# Patient Record
Sex: Female | Born: 1965 | Race: White | Hispanic: No | Marital: Single | Smoking: Current every day smoker
Health system: Southern US, Community
[De-identification: ages and names within clinical notes are randomized; demographics above are authoritative.]

## PROBLEM LIST (undated history)

## (undated) DIAGNOSIS — M2560 Stiffness of unspecified joint, not elsewhere classified: Secondary | ICD-10-CM

## (undated) DIAGNOSIS — J45909 Unspecified asthma, uncomplicated: Secondary | ICD-10-CM

## (undated) DIAGNOSIS — M25449 Effusion, unspecified hand: Secondary | ICD-10-CM

## (undated) DIAGNOSIS — M542 Cervicalgia: Secondary | ICD-10-CM

## (undated) DIAGNOSIS — F32A Depression, unspecified: Secondary | ICD-10-CM

## (undated) DIAGNOSIS — Z96643 Presence of artificial hip joint, bilateral: Secondary | ICD-10-CM

## (undated) DIAGNOSIS — G8929 Other chronic pain: Secondary | ICD-10-CM

## (undated) DIAGNOSIS — M1990 Unspecified osteoarthritis, unspecified site: Secondary | ICD-10-CM

## (undated) DIAGNOSIS — F419 Anxiety disorder, unspecified: Secondary | ICD-10-CM

## (undated) DIAGNOSIS — M549 Dorsalgia, unspecified: Principal | ICD-10-CM

## (undated) DIAGNOSIS — Z720 Tobacco use: Secondary | ICD-10-CM

## (undated) DIAGNOSIS — J449 Chronic obstructive pulmonary disease, unspecified: Secondary | ICD-10-CM

## (undated) DIAGNOSIS — F329 Major depressive disorder, single episode, unspecified: Secondary | ICD-10-CM

## (undated) DIAGNOSIS — M2550 Pain in unspecified joint: Secondary | ICD-10-CM

## (undated) HISTORY — DX: Stiffness of unspecified joint, not elsewhere classified: M25.60

## (undated) HISTORY — PX: TOTAL HIP ARTHROPLASTY: SHX124

## (undated) HISTORY — DX: Dorsalgia, unspecified: M54.9

## (undated) HISTORY — DX: Other chronic pain: G89.29

## (undated) HISTORY — DX: Depression, unspecified: F32.A

## (undated) HISTORY — DX: Major depressive disorder, single episode, unspecified: F32.9

## (undated) HISTORY — DX: Presence of artificial hip joint, bilateral: Z96.643

## (undated) HISTORY — DX: Tobacco use: Z72.0

## (undated) HISTORY — DX: Chronic obstructive pulmonary disease, unspecified: J44.9

## (undated) HISTORY — DX: Anxiety disorder, unspecified: F41.9

## (undated) HISTORY — DX: Effusion, unspecified hand: M25.449

## (undated) HISTORY — DX: Cervicalgia: M54.2

## (undated) HISTORY — PX: TONSILLECTOMY: SUR1361

## (undated) HISTORY — DX: Pain in unspecified joint: M25.50

---

## 2014-01-14 ENCOUNTER — Encounter (HOSPITAL_COMMUNITY): Payer: Self-pay | Admitting: Family Medicine

## 2014-01-14 ENCOUNTER — Emergency Department (HOSPITAL_COMMUNITY)
Admission: EM | Admit: 2014-01-14 | Discharge: 2014-01-14 | Disposition: A | Discharge status: Home / Self Care | Payer: BC Managed Care – PPO | Attending: Family Medicine | Admitting: Family Medicine

## 2014-01-14 DIAGNOSIS — J45909 Unspecified asthma, uncomplicated: Secondary | ICD-10-CM | POA: Diagnosis not present

## 2014-01-14 DIAGNOSIS — F17200 Nicotine dependence, unspecified, uncomplicated: Secondary | ICD-10-CM | POA: Diagnosis not present

## 2014-01-14 DIAGNOSIS — M25559 Pain in unspecified hip: Secondary | ICD-10-CM | POA: Insufficient documentation

## 2014-01-14 DIAGNOSIS — M545 Low back pain, unspecified: Secondary | ICD-10-CM | POA: Diagnosis not present

## 2014-01-14 DIAGNOSIS — M549 Dorsalgia, unspecified: Secondary | ICD-10-CM | POA: Diagnosis present

## 2014-01-14 DIAGNOSIS — Z79899 Other long term (current) drug therapy: Secondary | ICD-10-CM | POA: Insufficient documentation

## 2014-01-14 DIAGNOSIS — M129 Arthropathy, unspecified: Secondary | ICD-10-CM | POA: Insufficient documentation

## 2014-01-14 HISTORY — DX: Unspecified asthma, uncomplicated: J45.909

## 2014-01-14 HISTORY — DX: Unspecified osteoarthritis, unspecified site: M19.90

## 2014-01-14 LAB — I-STAT CHEM 8, ED
BUN: 15 mg/dL (ref 6–23)
CREATININE: 0.8 mg/dL (ref 0.50–1.10)
Calcium, Ion: 1.18 mmol/L (ref 1.12–1.23)
Chloride: 110 mEq/L (ref 96–112)
Glucose, Bld: 85 mg/dL (ref 70–99)
HCT: 46 % (ref 36.0–46.0)
HEMOGLOBIN: 15.6 g/dL — AB (ref 12.0–15.0)
POTASSIUM: 3.6 meq/L — AB (ref 3.7–5.3)
SODIUM: 135 meq/L — AB (ref 137–147)
TCO2: 23 mmol/L (ref 0–100)

## 2014-01-14 MED ORDER — TRAMADOL HCL 50 MG PO TABS: 100.0000 mg | ORAL_TABLET | Freq: Four times a day (QID) | ORAL | Status: DC | PRN

## 2014-01-14 MED ORDER — KETOROLAC TROMETHAMINE 60 MG/2ML IM SOLN
60.0000 mg | Freq: Once | INTRAMUSCULAR | Status: AC
  Administered 2014-01-14: 60 mg via INTRAMUSCULAR
  Filled 2014-01-14: qty 2

## 2014-01-14 MED ORDER — NABUMETONE 500 MG PO TABS: 500.0000 mg | ORAL_TABLET | Freq: Every day | ORAL | Status: DC

## 2014-01-14 NOTE — ED Notes (Signed)
48 yo female with Osteoarthritic pain. HX of Bilat Hip replacement. Reports generalized pain from hips down to feet as wells as L/R hand pain. Pt alert oriented. New to the area recently being seen by Ortho and PCP.

## 2014-01-14 NOTE — ED Notes (Signed)
MD at bedside. 

## 2014-01-14 NOTE — ED Provider Notes (Addendum)
CSN: [id number]     Arrival date & time 01/14/14  0744 History   First MD Initiated Contact with Patient 01/14/14 0805     Chief Complaint  Patient presents with  . Osteoarthritis     (Consider location/radiation/quality/duration/timing/severity/associated sxs/prior Treatment) HPI Comments: Hip and back pain Constant Just moved to the area Has stairs to get to apartment Achy and throbbing  Hot shower w/ benefit Ran out of medication after moving from [place] In the past meloxicam w/o benefit. Tramadol 100 and hydrocodone 7.5 and Relafen w/ benefit.  daliy stretches w/ benefit.   The history is provided by the patient.      Past Medical History  Diagnosis Date  . Arthritis   . Asthma    Past Surgical History  Procedure Laterality Date  . Total hip arthroplasty Bilateral   . Tonsillectomy     History reviewed. No pertinent family history. History  Substance Use Topics  . Smoking status: Current Every Day Smoker -- 1.00 packs/day  . Smokeless tobacco: Not on file  . Alcohol Use: No   OB History   Grav Para Term Preterm Abortions TAB SAB Ect Mult Living                 Review of Systems  Respiratory: Negative for shortness of breath.   Cardiovascular: Negative for chest pain, palpitations and leg swelling.  Musculoskeletal: Positive for arthralgias, back pain, gait problem and joint swelling.  All other systems reviewed and are negative.     Allergies  Review of patient's allergies indicates no known allergies.  Home Medications   Prior to Admission medications   Medication Sig Start Date End Date Taking? Authorizing Provider  nabumetone (RELAFEN) 500 MG tablet Take 1-2 tablets (500-1,000 mg total) by mouth daily. 01/14/14   Waldemar Dickens, MD  traMADol (ULTRAM) 50 MG tablet Take 2 tablets (100 mg total) by mouth every 6 (six) hours as needed. 01/14/14   Waldemar Dickens, MD   BP 113/64  Pulse 77  Temp(Src) 97.8 F (36.6 C) (Oral)  SpO2  100% Physical Exam  Constitutional: She is oriented to person, place, and time. She appears well-developed and well-nourished. No distress.  HENT:  Head: Normocephalic and atraumatic.  Eyes: EOM are normal. Pupils are equal, round, and reactive to light.  Neck: Normal range of motion.  Cardiovascular: Normal rate.   Pulmonary/Chest: Effort normal and breath sounds normal.  Musculoskeletal:  FRom but slow due to pain.  Lower back pain that is not reproducible to palpation.  No joint effusion   Neurological: She is alert and oriented to person, place, and time. No cranial nerve deficit. Coordination normal.  Skin: Skin is warm and dry. She is not diaphoretic.  Psychiatric: She has a normal mood and affect. Her behavior is normal. Judgment normal.    ED Course  Procedures (including critical care time) Labs Review Labs Reviewed  I-STAT CHEM 8, ED    Imaging Review No results found.   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 Pt just moved to area and trying to get established w/ pcp. Chem 8 - nml renal function toradol 60IM - Rx for Relafen (start in 24 hours) and tramadol provided - continue rehab exercises Precautions given and all questions answered  Linna Darner, MD Family Medicine 01/14/2014, 8:37 AM  Waldemar Dickens, MD 01/14/14 [place], MD 01/14/14 [phone]

## 2014-01-14 NOTE — Discharge Instructions (Signed)
You have significant arthritis that will require further treatment Please get in to see a regular doctor Start the relafen in 24 hours Continue to do your daily stretches and exercises.  Best of luck  Arthralgia Arthralgia is joint pain. A joint is a place where two bones meet. Joint pain can happen for many reasons. The joint can be bruised, stiff, infected, or weak from aging. Pain usually goes away after resting and taking medicine for soreness.  HOME CARE  Rest the joint as told by your doctor.  Keep the sore joint raised (elevated) for the first 24 hours.  Put ice on the joint area.  Put ice in a plastic bag.  Place a towel between your skin and the bag.  Leave the ice on for 15-20 minutes, 03-04 times a day.  Wear your splint, casting, elastic bandage, or sling as told by your doctor.  Only take medicine as told by your doctor. Do not take aspirin.  Use crutches as told by your doctor. Do not put weight on the joint until told to by your doctor. GET HELP RIGHT AWAY IF:   You have bruising, puffiness (swelling), or more pain.  Your fingers or toes turn blue or start to lose feeling (numb).  Your medicine does not lessen the pain.  Your pain becomes severe.  You have a temperature by mouth above 102 F (38.9 C), not controlled by medicine.  You cannot move or use the joint. MAKE SURE YOU:   Understand these instructions.  Will watch your condition.  Will get help right away if you are not doing well or get worse. Document Released: 04/26/2009 Document Revised: 07/31/2011 Document Reviewed: 04/26/2009 [facility] Patient Information 2015 [place], [place]. This information is not intended to replace advice given to you by your health care provider. Make sure you discuss any questions you have with your health care provider.

## 2014-02-09 ENCOUNTER — Encounter (HOSPITAL_COMMUNITY): Payer: Self-pay | Admitting: Emergency Medicine

## 2014-02-09 ENCOUNTER — Emergency Department (HOSPITAL_COMMUNITY)
Admission: EM | Admit: 2014-02-09 | Discharge: 2014-02-09 | Disposition: A | Discharge status: Home / Self Care | Payer: BC Managed Care – PPO | Attending: Emergency Medicine | Admitting: Emergency Medicine

## 2014-02-09 DIAGNOSIS — M2559 Pain in other specified joint: Secondary | ICD-10-CM | POA: Diagnosis not present

## 2014-02-09 DIAGNOSIS — Z791 Long term (current) use of non-steroidal anti-inflammatories (NSAID): Secondary | ICD-10-CM | POA: Diagnosis not present

## 2014-02-09 DIAGNOSIS — M2550 Pain in unspecified joint: Secondary | ICD-10-CM

## 2014-02-09 DIAGNOSIS — Z96649 Presence of unspecified artificial hip joint: Secondary | ICD-10-CM | POA: Insufficient documentation

## 2014-02-09 DIAGNOSIS — J45909 Unspecified asthma, uncomplicated: Secondary | ICD-10-CM | POA: Diagnosis not present

## 2014-02-09 DIAGNOSIS — M129 Arthropathy, unspecified: Secondary | ICD-10-CM | POA: Diagnosis not present

## 2014-02-09 DIAGNOSIS — M549 Dorsalgia, unspecified: Secondary | ICD-10-CM | POA: Diagnosis present

## 2014-02-09 MED ORDER — TRAMADOL HCL 50 MG PO TABS: 100.0000 mg | ORAL_TABLET | Freq: Two times a day (BID) | ORAL | Status: DC | PRN

## 2014-02-09 MED ORDER — TRAMADOL HCL 50 MG PO TABS
100.0000 mg | ORAL_TABLET | Freq: Once | ORAL | Status: AC
  Administered 2014-02-09: 100 mg via ORAL
  Filled 2014-02-09: qty 2

## 2014-02-09 MED ORDER — NABUMETONE 500 MG PO TABS: 500.0000 mg | ORAL_TABLET | Freq: Every day | ORAL | Status: DC

## 2014-02-09 NOTE — ED Provider Notes (Signed)
CSN: [id number]     Arrival date & time 02/09/14  0109 History   First MD Initiated Contact with Patient 02/09/14 [phone]     Chief Complaint  Patient presents with  . Back Pain     (Consider location/radiation/quality/duration/timing/severity/associated sxs/prior Treatment) Patient is a 48 y.o. female presenting with back pain. The history is provided by the patient.  Back Pain Location:  Generalized Quality:  Aching and stiffness Stiffness is present:  All day Radiates to:  Does not radiate Pain severity:  Mild Pain is:  Same all the time Onset quality: chronic. Duration: since she was in her late 20's. Timing:  Constant Progression:  Waxing and waning Chronicity:  Chronic Relieved by:  NSAIDs and heating pad Associated symptoms: no bladder incontinence, no bowel incontinence, no chest pain, no fever, no headaches, no numbness, no paresthesias, no weakness and no weight loss   Risk factors: hx of osteoporosis   Risk factors: no hx of cancer    New to the area < 2 mo and has not found a PCP. Would like referral and has insurance. Bilateral hip replacements in the 30's due to severe OA.  Past Medical History  Diagnosis Date  . Arthritis   . Asthma    Past Surgical History  Procedure Laterality Date  . Total hip arthroplasty Bilateral   . Tonsillectomy     No family history on file. History  Substance Use Topics  . Smoking status: Current Every Day Smoker -- 1.00 packs/day  . Smokeless tobacco: Not on file  . Alcohol Use: No   OB History   Grav Para Term Preterm Abortions TAB SAB Ect Mult Living                 Review of Systems  Constitutional: Negative for fever and weight loss.  Cardiovascular: Negative for chest pain.  Gastrointestinal: Negative for bowel incontinence.  Genitourinary: Negative for bladder incontinence.  Musculoskeletal: Positive for back pain.  Neurological: Negative for weakness, numbness, headaches and paresthesias.      Allergies   Review of patient's allergies indicates no known allergies.  Home Medications   Prior to Admission medications   Medication Sig Start Date End Date Taking? Authorizing Provider  albuterol (PROVENTIL HFA;VENTOLIN HFA) 108 (90 BASE) MCG/ACT inhaler Inhale 2 puffs into the lungs every 6 (six) hours as needed for wheezing or shortness of breath.   Yes Historical Provider, MD  nabumetone (RELAFEN) 500 MG tablet Take 1 tablet (500 mg total) by mouth daily. 02/09/14   Vineeth Fell Marilu Favre, PA-C  traMADol (ULTRAM) 50 MG tablet Take 2 tablets (100 mg total) by mouth every 12 (twelve) hours as needed. 02/09/14   Nohlan Burdin Marilu Favre, PA-C   BP 128/74  Pulse 80  Temp(Src) 97.7 F (36.5 C) (Oral)  Resp 18  SpO2 100% Physical Exam  Nursing note and vitals reviewed. Constitutional: She appears well-developed and well-nourished. No distress.  HENT:  Head: Normocephalic and atraumatic.  Eyes: Pupils are equal, round, and reactive to light.  Neck: Normal range of motion. Neck supple.  Cardiovascular: Normal rate and regular rhythm.   Pulmonary/Chest: Effort normal.  Abdominal: Soft.  Musculoskeletal:  Pt has equal strength to bilateral lower extremities.  Neurosensory function adequate to both legs No clonus on dorsiflextion Skin color is normal. Skin is warm and moist.  I see no step off deformity, no midline bony tenderness.  Pt is able to ambulate.  No crepitus, laceration, effusion, induration, lesions, swelling.   Pedal pulses  are symmetrical and palpable bilaterally  No tenderness to palpation   Neurological: She is alert.  Skin: Skin is warm and dry.    ED Course  Procedures (including critical care time) Labs Review Labs Reviewed - No data to display  Imaging Review No results found.   EKG Interpretation None      MDM   Final diagnoses:  Arthralgia    Hx of bilateral hip replacements. Needs refill of her Ultram and Relafan. Has been on Hydrocodone for years but doesn't feel  that she needs a refill of this, ultram is working fine.   Rx for Relagen and Tramadol given. Renal function checked at last visit on 8/26 and it was WNL.  She is to continue stretching and try to make appt with PCP from referrals I give her.  48 y.o.Mileigh Heft's evaluation in the Emergency Department is complete. It has been determined that no acute conditions requiring further emergency intervention are present at this time. The patient/guardian have been advised of the diagnosis and plan. We have discussed signs and symptoms that warrant return to the ED, such as changes or worsening in symptoms.  Vital signs are stable at discharge. Filed Vitals:   02/09/14 0722  BP: 128/74  Pulse: 80  Temp: 97.7 F (36.5 C)  Resp: 18    Patient/guardian has voiced understanding and agreed to follow-up with the PCP or specialist.     Linus Mako, PA-C 02/09/14 [phone]

## 2014-02-09 NOTE — ED Notes (Addendum)
States has osteo arthritis and she  Started to have pain day before yesterday came here last month and got meds lost paper for her f/u and she ran out out of meds states her lower back and knees hurt

## 2014-02-09 NOTE — Discharge Instructions (Signed)
Arthralgia °Your caregiver has diagnosed you as suffering from an arthralgia. Arthralgia means there is pain in a joint. This can come from many reasons including: °· Bruising the joint which causes soreness (inflammation) in the joint. °· Wear and tear on the joints which occur as we grow older (osteoarthritis). °· Overusing the joint. °· Various forms of arthritis. °· Infections of the joint. °Regardless of the cause of pain in your joint, most of these different pains respond to anti-inflammatory drugs and rest. The exception to this is when a joint is infected, and these cases are treated with antibiotics, if it is a bacterial infection. °HOME CARE INSTRUCTIONS  °· Rest the injured area for as long as directed by your caregiver. Then slowly start using the joint as directed by your caregiver and as the pain allows. Crutches as directed may be useful if the ankles, knees or hips are involved. If the knee was splinted or casted, continue use and care as directed. If an stretchy or elastic wrapping bandage has been applied today, it should be removed and re-applied every 3 to 4 hours. It should not be applied tightly, but firmly enough to keep swelling down. Watch toes and feet for swelling, bluish discoloration, coldness, numbness or excessive pain. If any of these problems (symptoms) occur, remove the ace bandage and re-apply more loosely. If these symptoms persist, contact your caregiver or return to this location. °· For the first 24 hours, keep the injured extremity elevated on pillows while lying down. °· Apply ice for 15-20 minutes to the sore joint every couple hours while awake for the first half day. Then 03-04 times per day for the first 48 hours. Put the ice in a plastic bag and place a towel between the bag of ice and your skin. °· Wear any splinting, casting, elastic bandage applications, or slings as instructed. °· Only take over-the-counter or prescription medicines for pain, discomfort, or fever as  directed by your caregiver. Do not use aspirin immediately after the injury unless instructed by your physician. Aspirin can cause increased bleeding and bruising of the tissues. °· If you were given crutches, continue to use them as instructed and do not resume weight bearing on the sore joint until instructed. °Persistent pain and inability to use the sore joint as directed for more than 2 to 3 days are warning signs indicating that you should see a caregiver for a follow-up visit as soon as possible. Initially, a hairline fracture (break in bone) may not be evident on X-rays. Persistent pain and swelling indicate that further evaluation, non-weight bearing or use of the joint (use of crutches or slings as instructed), or further X-rays are indicated. X-rays may sometimes not show a small fracture until a week or 10 days later. Make a follow-up appointment with your own caregiver or one to whom we have referred you. A radiologist (specialist in reading X-rays) may read your X-rays. Make sure you know how you are to obtain your X-ray results. Do not assume everything is normal if you do not hear from us. °SEEK MEDICAL CARE IF: °Bruising, swelling, or pain increases. °SEEK IMMEDIATE MEDICAL CARE IF:  °· Your fingers or toes are numb or blue. °· The pain is not responding to medications and continues to stay the same or get worse. °· The pain in your joint becomes severe. °· You develop a fever over 102° F (38.9° C). °· It becomes impossible to move or use the joint. °MAKE SURE YOU:  °·   Understand these instructions. °· Will watch your condition. °· Will get help right away if you are not doing well or get worse. °Document Released: 05/08/2005 Document Revised: 07/31/2011 Document Reviewed: 12/25/2007 °ExitCare® Patient Information ©2015 [employer]. This information is not intended to replace advice given to you by your health care provider. Make sure you discuss any questions you have with your health care  provider. °Osteoarthritis °Osteoarthritis is a disease that causes soreness and inflammation of a joint. It occurs when the cartilage at the affected joint wears down. Cartilage acts as a cushion, covering the ends of bones where they meet to form a joint. Osteoarthritis is the most common form of arthritis. It often occurs in older people. The joints affected most often by this condition include those in the: °· Ends of the fingers. °· Thumbs. °· Neck. °· Lower back. °· Knees. °· Hips. °CAUSES  °Over time, the cartilage that covers the ends of bones begins to wear away. This causes bone to rub on bone, producing pain and stiffness in the affected joints.  °RISK FACTORS °Certain factors can increase your chances of having osteoarthritis, including: °· Older age. °· Excessive body weight. °· Overuse of joints. °· Previous joint injury. °SIGNS AND SYMPTOMS  °· Pain, swelling, and stiffness in the joint. °· Over time, the joint may lose its normal shape. °· Small deposits of bone (osteophytes) may grow on the edges of the joint. °· Bits of bone or cartilage can break off and float inside the joint space. This may cause more pain and damage. °DIAGNOSIS  °Your health care provider will do a physical exam and ask about your symptoms. Various tests may be ordered, such as: °· X-rays of the affected joint. °· An MRI scan. °· Blood tests to rule out other types of arthritis. °· Joint fluid tests. This involves using a needle to draw fluid from the joint and examining the fluid under a microscope. °TREATMENT  °Goals of treatment are to control pain and improve joint function. Treatment plans may include: °· A prescribed exercise program that allows for rest and joint relief. °· A weight control plan. °· Pain relief techniques, such as: °¨ Properly applied heat and cold. °¨ Electric pulses delivered to nerve endings under the skin (transcutaneous electrical nerve stimulation [TENS]). °¨ Massage. °¨ Certain nutritional  supplements. °· Medicines to control pain, such as: °¨ Acetaminophen. °¨ Nonsteroidal anti-inflammatory drugs (NSAIDs), such as naproxen. °¨ Narcotic or central-acting agents, such as tramadol. °¨ Corticosteroids. These can be given orally or as an injection. °· Surgery to reposition the bones and relieve pain (osteotomy) or to remove loose pieces of bone and cartilage. Joint replacement may be needed in advanced states of osteoarthritis. °HOME CARE INSTRUCTIONS  °· Take medicines only as directed by your health care provider. °· Maintain a healthy weight. Follow your health care provider's instructions for weight control. This may include dietary instructions. °· Exercise as directed. Your health care provider can recommend specific types of exercise. These may include: °¨ Strengthening exercises. These are done to strengthen the muscles that support joints affected by arthritis. They can be performed with weights or with exercise bands to add resistance. °¨ Aerobic activities. These are exercises, such as brisk walking or low-impact aerobics, that get your heart pumping. °¨ Range-of-motion activities. These keep your joints limber. °¨ Balance and agility exercises. These help you maintain daily living skills. °· Rest your affected joints as directed by your health care provider. °· Keep all follow-up   visits as directed by your health care provider. °SEEK MEDICAL CARE IF:  °· Your skin turns red. °· You develop a rash in addition to your joint pain. °· You have worsening joint pain. °· You have a fever along with joint or muscle aches. °SEEK IMMEDIATE MEDICAL CARE IF: °· You have a significant loss of weight or appetite. °· You have night sweats. °FOR MORE INFORMATION  °· National Institute of Arthritis and Musculoskeletal and Skin Diseases: www.niams.nih.gov °· National Institute on Aging: www.nia.nih.gov °· American College of Rheumatology: www.rheumatology.org °Document Released: 05/08/2005 Document Revised:  09/22/2013 Document Reviewed: 01/13/2013 °ExitCare® Patient Information ©2015 [employer]. This information is not intended to replace advice given to you by your health care provider. Make sure you discuss any questions you have with your health care provider. ° °

## 2014-02-10 NOTE — ED Provider Notes (Signed)
Medical screening examination/treatment/procedure(s) were conducted as a shared visit with non-physician practitioner(s) and myself.  I personally evaluated the patient during the encounter.  Pt c/o chronic low back pain. No recent injury or fall. No new numbness/weakness. No fevers. Spine nt.    Mirna Mires, MD 02/10/14 1310

## 2014-03-16 ENCOUNTER — Encounter (HOSPITAL_COMMUNITY): Payer: Self-pay | Admitting: Emergency Medicine

## 2014-03-16 ENCOUNTER — Emergency Department (HOSPITAL_COMMUNITY)
Admission: EM | Admit: 2014-03-16 | Discharge: 2014-03-16 | Disposition: A | Discharge status: Home / Self Care | Payer: BC Managed Care – PPO | Attending: Emergency Medicine | Admitting: Emergency Medicine

## 2014-03-16 DIAGNOSIS — J45909 Unspecified asthma, uncomplicated: Secondary | ICD-10-CM | POA: Insufficient documentation

## 2014-03-16 DIAGNOSIS — Z79899 Other long term (current) drug therapy: Secondary | ICD-10-CM | POA: Insufficient documentation

## 2014-03-16 DIAGNOSIS — Z720 Tobacco use: Secondary | ICD-10-CM | POA: Insufficient documentation

## 2014-03-16 DIAGNOSIS — M25572 Pain in left ankle and joints of left foot: Secondary | ICD-10-CM | POA: Insufficient documentation

## 2014-03-16 DIAGNOSIS — Z7982 Long term (current) use of aspirin: Secondary | ICD-10-CM | POA: Insufficient documentation

## 2014-03-16 DIAGNOSIS — M1990 Unspecified osteoarthritis, unspecified site: Secondary | ICD-10-CM

## 2014-03-16 DIAGNOSIS — M159 Polyosteoarthritis, unspecified: Secondary | ICD-10-CM | POA: Insufficient documentation

## 2014-03-16 DIAGNOSIS — G8929 Other chronic pain: Secondary | ICD-10-CM | POA: Insufficient documentation

## 2014-03-16 MED ORDER — MELOXICAM 7.5 MG PO TABS: 7.5000 mg | ORAL_TABLET | Freq: Every day | ORAL | Status: DC

## 2014-03-16 MED ORDER — TRAMADOL HCL 50 MG PO TABS: 50.0000 mg | ORAL_TABLET | Freq: Four times a day (QID) | ORAL | Status: DC | PRN

## 2014-03-16 NOTE — ED Notes (Signed)
Pt c/o diffuse joint pain r/t osteo arthritis.  States she was tx here 1 month prior for same with tramadol.  However, tramadol has run out, pain has returned, and she still hasn't found MD, seeing as she just moved here several months ago.

## 2014-03-16 NOTE — ED Provider Notes (Signed)
Medical screening examination/treatment/procedure(s) were performed by non-physician practitioner and as supervising physician I was immediately available for consultation/collaboration.   EKG Interpretation None       Orlie Dakin, MD 03/16/14 1734

## 2014-03-16 NOTE — ED Provider Notes (Signed)
CSN: [id number]     Arrival date & time 03/16/14  4680 History   First MD Initiated Contact with Patient 03/16/14 [phone]     Chief Complaint  Patient presents with  . Joint Pain     (Consider location/radiation/quality/duration/timing/severity/associated sxs/prior Treatment) The history is provided by the patient.    PT reports chronic joint pain x years in all joints except her hips, which are replaced.  Worst pain is in low back, bilateral knees, bilateral feet.  Pain unchanged x years but worse today because she worked long hours over the weekend.  Pain is always worse after working a long shift on her feet ([place]).   Denies fevers, chills, abdominal pain, loss of control of bowel or bladder, weakness of numbness of the extremities, saddle anesthesia, bowel, urinarycomplaints.   No hx CA, IVDU, recent infections.  Denies red, hot, swollen joints.  Denies injury.     Past Medical History  Diagnosis Date  . Arthritis   . Asthma    Past Surgical History  Procedure Laterality Date  . Total hip arthroplasty Bilateral   . Tonsillectomy     No family history on file. History  Substance Use Topics  . Smoking status: Current Every Day Smoker -- 1.00 packs/day    Types: Cigarettes  . Smokeless tobacco: Not on file  . Alcohol Use: No   OB History   Grav Para Term Preterm Abortions TAB SAB Ect Mult Living                 Review of Systems  All other systems reviewed and are negative.     Allergies  Review of patient's allergies indicates no known allergies.  Home Medications   Prior to Admission medications   Medication Sig Start Date End Date Taking? Authorizing Provider  albuterol (PROVENTIL HFA;VENTOLIN HFA) 108 (90 BASE) MCG/ACT inhaler Inhale 2 puffs into the lungs every 6 (six) hours as needed for wheezing or shortness of breath.   Yes Historical Provider, MD  Aspirin-Acetaminophen-Caffeine (GOODY HEADACHE PO) Take 1 packet by mouth daily as needed (for pain).    Yes Historical Provider, MD  traMADol (ULTRAM) 50 MG tablet Take 50-100 mg by mouth every 6 (six) hours as needed for moderate pain.   Yes Historical Provider, MD   BP 139/90  Pulse 88  Temp(Src) 97.7 F (36.5 C) (Oral)  Resp 20  Ht 5\' 7"  (1.702 m)  Wt 200 lb (90.719 kg)  BMI 31.32 kg/m2  SpO2 100%  LMP 03/02/2014 Physical Exam  Nursing note and vitals reviewed. Constitutional: She appears well-developed and well-nourished. No distress.  HENT:  Head: Normocephalic and atraumatic.  Neck: Neck supple.  Pulmonary/Chest: Effort normal.  Musculoskeletal:  Spine nontender, no crepitus, or stepoffs. Extremities:  Strength 5/5, sensation intact, distal pulses intact.   Visible joints without erythema, edema, warmth, tenderness.   Neurological: She is alert.  Skin: She is not diaphoretic.    ED Course  Procedures (including critical care time) Labs Review Labs Reviewed - No data to display  Imaging Review No results found.   EKG Interpretation None      Pt has resources for follow up from previous visit.   MDM   Final diagnoses:  Chronic osteoarthritis   Afebrile, nontoxic patient with arthralgia that is chronic, unchanged.  Pt is out of medications.  No e/o gout, septic joint.   D/C home with mobic, tramadol.  Discussed result, findings, treatment, and follow up  with patient.  Pt  given return precautions.  Pt verbalizes understanding and agrees with plan.         Clayton Bibles, PA-C 03/16/14 1318

## 2014-03-16 NOTE — Discharge Instructions (Signed)
Read the information below.  Use the prescribed medication as directed.  Please discuss all new medications with your pharmacist.  You may return to the Emergency Department at any time for worsening condition or any new symptoms that concern you.   If you develop uncontrolled pain, weakness or numbness of the extremity, severe discoloration of the skin, or you are unable to walk or move your arms or legs, return to the ER for a recheck.      Arthritis, Nonspecific Arthritis is inflammation of a joint. This usually means pain, redness, warmth or swelling are present. One or more joints may be involved. There are a number of types of arthritis. Your caregiver may not be able to tell what type of arthritis you have right away. CAUSES  The most common cause of arthritis is the wear and tear on the joint (osteoarthritis). This causes damage to the cartilage, which can break down over time. The knees, hips, back and neck are most often affected by this type of arthritis. Other types of arthritis and common causes of joint pain include:  Sprains and other injuries near the joint. Sometimes minor sprains and injuries cause pain and swelling that develop hours later.  Rheumatoid arthritis. This affects hands, feet and knees. It usually affects both sides of your body at the same time. It is often associated with chronic ailments, fever, weight loss and general weakness.  Crystal arthritis. Gout and pseudo gout can cause occasional acute severe pain, redness and swelling in the foot, ankle, or knee.  Infectious arthritis. Bacteria can get into a joint through a break in overlying skin. This can cause infection of the joint. Bacteria and viruses can also spread through the blood and affect your joints.  Drug, infectious and allergy reactions. Sometimes joints can become mildly painful and slightly swollen with these types of illnesses. SYMPTOMS   Pain is the main symptom.  Your joint or joints can also  be red, swollen and warm or hot to the touch.  You may have a fever with certain types of arthritis, or even feel overall ill.  The joint with arthritis will hurt with movement. Stiffness is present with some types of arthritis. DIAGNOSIS  Your caregiver will suspect arthritis based on your description of your symptoms and on your exam. Testing may be needed to find the type of arthritis:  Blood and sometimes urine tests.  X-ray tests and sometimes CT or MRI scans.  Removal of fluid from the joint (arthrocentesis) is done to check for bacteria, crystals or other causes. Your caregiver (or a specialist) will numb the area over the joint with a local anesthetic, and use a needle to remove joint fluid for examination. This procedure is only minimally uncomfortable.  Even with these tests, your caregiver may not be able to tell what kind of arthritis you have. Consultation with a specialist (rheumatologist) may be helpful. TREATMENT  Your caregiver will discuss with you treatment specific to your type of arthritis. If the specific type cannot be determined, then the following general recommendations may apply. Treatment of severe joint pain includes:  Rest.  Elevation.  Anti-inflammatory medication (for example, ibuprofen) may be prescribed. Avoiding activities that cause increased pain.  Only take over-the-counter or prescription medicines for pain and discomfort as recommended by your caregiver.  Cold packs over an inflamed joint may be used for 10 to 15 minutes every hour. Hot packs sometimes feel better, but do not use overnight. Do not use hot packs  if you are diabetic without your caregiver's permission.  A cortisone shot into arthritic joints may help reduce pain and swelling.  Any acute arthritis that gets worse over the next 1 to 2 days needs to be looked at to be sure there is no joint infection. Long-term arthritis treatment involves modifying activities and lifestyle to reduce  joint stress jarring. This can include weight loss. Also, exercise is needed to nourish the joint cartilage and remove waste. This helps keep the muscles around the joint strong. HOME CARE INSTRUCTIONS   Do not take aspirin to relieve pain if gout is suspected. This elevates uric acid levels.  Only take over-the-counter or prescription medicines for pain, discomfort or fever as directed by your caregiver.  Rest the joint as much as possible.  If your joint is swollen, keep it elevated.  Use crutches if the painful joint is in your leg.  Drinking plenty of fluids may help for certain types of arthritis.  Follow your caregiver's dietary instructions.  Try low-impact exercise such as:  Swimming.  Water aerobics.  Biking.  Walking.  Morning stiffness is often relieved by a warm shower.  Put your joints through regular range-of-motion. SEEK MEDICAL CARE IF:   You do not feel better in 24 hours or are getting worse.  You have side effects to medications, or are not getting better with treatment. SEEK IMMEDIATE MEDICAL CARE IF:   You have a fever.  You develop severe joint pain, swelling or redness.  Many joints are involved and become painful and swollen.  There is severe back pain and/or leg weakness.  You have loss of bowel or bladder control. Document Released: 06/15/2004 Document Revised: 07/31/2011 Document Reviewed: 07/01/2008 [facility] Patient Information 2015 [place], [place]. This information is not intended to replace advice given to you by your health care provider. Make sure you discuss any questions you have with your health care provider.

## 2014-04-06 ENCOUNTER — Ambulatory Visit: Payer: Self-pay | Admitting: Medical

## 2014-04-07 ENCOUNTER — Ambulatory Visit
Admission: RE | Admit: 2014-04-07 | Discharge: 2014-04-07 | Disposition: A | Discharge status: Home / Self Care | Payer: BC Managed Care – PPO | Source: Ambulatory Visit | Attending: Medical | Admitting: Medical

## 2014-04-07 ENCOUNTER — Ambulatory Visit (INDEPENDENT_AMBULATORY_CARE_PROVIDER_SITE_OTHER): Payer: BC Managed Care – PPO | Admitting: Medical

## 2014-04-07 ENCOUNTER — Encounter: Payer: Self-pay | Admitting: Medical

## 2014-04-07 VITALS — BP 100/80 | HR 72 | Temp 98.1°F | Resp 16 | Ht 67.2 in | Wt 209.0 lb

## 2014-04-07 DIAGNOSIS — M25561 Pain in right knee: Secondary | ICD-10-CM

## 2014-04-07 DIAGNOSIS — M2550 Pain in unspecified joint: Secondary | ICD-10-CM

## 2014-04-07 DIAGNOSIS — M549 Dorsalgia, unspecified: Secondary | ICD-10-CM

## 2014-04-07 DIAGNOSIS — M25449 Effusion, unspecified hand: Secondary | ICD-10-CM

## 2014-04-07 DIAGNOSIS — F17200 Nicotine dependence, unspecified, uncomplicated: Secondary | ICD-10-CM

## 2014-04-07 DIAGNOSIS — G8929 Other chronic pain: Secondary | ICD-10-CM

## 2014-04-07 DIAGNOSIS — M542 Cervicalgia: Secondary | ICD-10-CM

## 2014-04-07 DIAGNOSIS — M2560 Stiffness of unspecified joint, not elsewhere classified: Secondary | ICD-10-CM

## 2014-04-07 DIAGNOSIS — M25562 Pain in left knee: Secondary | ICD-10-CM

## 2014-04-07 DIAGNOSIS — Z720 Tobacco use: Secondary | ICD-10-CM

## 2014-04-07 MED ORDER — TRAMADOL HCL 50 MG PO TABS: 50.0000 mg | ORAL_TABLET | Freq: Two times a day (BID) | ORAL | Status: DC | PRN

## 2014-04-07 MED ORDER — NABUMETONE 500 MG PO TABS: 500.0000 mg | ORAL_TABLET | Freq: Two times a day (BID) | ORAL | Status: DC

## 2014-04-07 NOTE — Progress Notes (Signed)
Subjective: Here as a new patient today.   Here to establish care and to get pain medication for arthritis.     She has a hx/o long term tobacco use, chronic pain in back and neck, chronic joints pains for many years, hx/o depression, COPD.   She is mainly here for pain control of her chronic pains.   Has had arthritis since her 2s.  Has seen mulitple doctors over the years for the pains.  She notes ongoing daily chronic pain in neck, lumbar spine, feet, knees.  Gets morning stiffness, sometimes can't get out of bed when pain is bad enough.  Gets swelling in fingers, multiple, pain in fingers hands, wrists.  Had been seeing ortho/primary care in [place] q 2 months but he retired recently.  Hx/o 1 EDSI in back, hx/o hip steroid injections before ultimately getting bilat hip replacement in 2003, 2004. .  Takes 3 hot showers per day to help with joint pains, stretches daily before work.  Since being in [place], [place], has been to the ED twice for pain.  Last xrays 3-4 years ago for neck, back. No prior labs or xrays to help screen for RA.  Was seeing ortho/primary care Dr. Duanne Guess in [place] who was prescribing Hydrocodone 7.5mg  /325mg  TID, Relafen.     Has always been advised she has OA not RA.  Grandmother has hx/o early OA.   Aunt and her 2 daughters have RA.  Moved here 3 mo ago, lives with son, works at [employer].  She is on her feet all day.  Smoker, COPD, uses albuterol/neb occasionally.  Hx/ o PNA 15 years ago.  Smoker since age 89yo, 1ppd.        Objective: BP 100/80 mmHg  Pulse 72  Temp(Src) 98.1 F (36.7 C) (Oral)  Resp 16  Ht 5' 7.2" (1.707 m)  Wt 209 lb (94.802 kg)  BMI 32.53 kg/m2  LMP 03/02/2014  General appearance: alert, no distress, WD/WN, obese white female Neck: supple, no lymphadenopathy, no thyromegaly, no masses, normal ROM, no deformity, nontender Heart: RRR, normal S1, S2, no murmurs Lungs: CTA bilaterally, no wheezes, rhonchi, or rales Abdomen: +bs, soft,  non tender, non distended, no masses, no hepatomegaly, no splenomegaly Back:lumbar paraspinal tenderness, mild, but normal ROM, no obvious scoliosis. Musculoskeletal: long posteroir hip surgical scars bilat, mild tenderness over knees bilat, no swelling, no obvious deformity, otherwise legs nontender, no swelling, no obvious deformity Extremities: no edema, no cyanosis, no clubbing Pulses: 2+ symmetric, upper and lower extremities, normal cap refill Neurological: alert, oriented x 3, CN2-12 intact, strength normal upper extremities and lower extremities, nonfocal exam Psychiatric: normal affect, behavior normal, pleasant    Assessment: Encounter Diagnoses  Name Primary?  . Chronic back pain Yes  . Polyarthralgia   . Finger joint swelling, unspecified laterality   . Morning stiffness of joints   . Chronic neck pain   . Bilateral knee pain   . Smoker      Plan: we discussed her symptoms, history, prior medication use, prior eval and treatment.    She has enough questionable symptoms to warrant eval for RA.   Labs and xrays of hands and lumbar spine today as a baseline.   Will request old chart from prior PCM in [place], [place].    Discussed difference between RA, OA, eval and treatment.  Discuss effects of tobacco on chronic pain.   Encouraged her to consider stop tobacco.   Reviewed recent multiple ED visits for similar symptoms.  Script today for Relafen, BID Ultram prn.  F/u pending studies, records.

## 2014-04-08 LAB — URIC ACID: URIC ACID, SERUM: 3.5 mg/dL (ref 2.4–7.0)

## 2014-04-08 LAB — SEDIMENTATION RATE: Sed Rate: 6 mm/hr (ref 0–22)

## 2014-04-08 LAB — RHEUMATOID FACTOR: Rhuematoid fact SerPl-aCnc: <10 IU/mL (ref <=14)

## 2014-04-08 LAB — ANA: ANA: NEGATIVE

## 2014-04-09 LAB — CYCLIC CITRUL PEPTIDE ANTIBODY, IGG: Cyclic Citrullin Peptide Ab: <2 U/mL (ref 0.0–5.0)

## 2014-04-23 ENCOUNTER — Telehealth: Payer: Self-pay | Admitting: Medical

## 2014-04-23 NOTE — Telephone Encounter (Signed)
Do we have old records?

## 2014-05-01 NOTE — Telephone Encounter (Signed)
Records have not came in. Office is not answering phone to get a status update as to when to expect the records. Refaxed the records request

## 2014-05-05 NOTE — Telephone Encounter (Signed)
pls check to see if we have received records.

## 2014-05-06 NOTE — Telephone Encounter (Signed)
Linda Pulse, do we have records on this patient ?

## 2014-05-06 NOTE — Telephone Encounter (Signed)
No we do not have any records on the pt as of yet.

## 2014-05-06 NOTE — Telephone Encounter (Signed)
See message from [place] below

## 2014-05-07 NOTE — Telephone Encounter (Signed)
Unfortunately we never received anything.   Please re-send request a third time and make sure it says this is the 3rd request .  She should also call the same folks and express that she needs records sent ASAP.

## 2014-06-03 ENCOUNTER — Encounter: Payer: Self-pay | Admitting: Medical

## 2014-06-03 ENCOUNTER — Ambulatory Visit (INDEPENDENT_AMBULATORY_CARE_PROVIDER_SITE_OTHER): Payer: BLUE CROSS/BLUE SHIELD | Admitting: Medical

## 2014-06-03 VITALS — BP 110/80 | HR 88 | Temp 97.5°F | Wt 224.0 lb

## 2014-06-03 DIAGNOSIS — Z720 Tobacco use: Secondary | ICD-10-CM

## 2014-06-03 DIAGNOSIS — G8929 Other chronic pain: Secondary | ICD-10-CM

## 2014-06-03 DIAGNOSIS — M549 Dorsalgia, unspecified: Secondary | ICD-10-CM

## 2014-06-03 DIAGNOSIS — M2550 Pain in unspecified joint: Secondary | ICD-10-CM

## 2014-06-03 DIAGNOSIS — F17200 Nicotine dependence, unspecified, uncomplicated: Secondary | ICD-10-CM

## 2014-06-03 DIAGNOSIS — G5792 Unspecified mononeuropathy of left lower limb: Secondary | ICD-10-CM

## 2014-06-03 DIAGNOSIS — E669 Obesity, unspecified: Secondary | ICD-10-CM

## 2014-06-03 DIAGNOSIS — M722 Plantar fascial fibromatosis: Secondary | ICD-10-CM

## 2014-06-03 DIAGNOSIS — G5791 Unspecified mononeuropathy of right lower limb: Secondary | ICD-10-CM

## 2014-06-03 DIAGNOSIS — G5793 Unspecified mononeuropathy of bilateral lower limbs: Secondary | ICD-10-CM

## 2014-06-03 MED ORDER — TRAMADOL HCL 50 MG PO TABS: 50.0000 mg | ORAL_TABLET | Freq: Two times a day (BID) | ORAL | Status: DC

## 2014-06-03 MED ORDER — MELOXICAM 15 MG PO TABS: 15.0000 mg | ORAL_TABLET | Freq: Every day | ORAL | Status: DC

## 2014-06-03 MED ORDER — GABAPENTIN 300 MG PO CAPS: 300.0000 mg | ORAL_CAPSULE | Freq: Every day | ORAL | Status: DC

## 2014-06-03 MED ORDER — METHOCARBAMOL 500 MG PO TABS: 500.0000 mg | ORAL_TABLET | Freq: Three times a day (TID) | ORAL | Status: DC | PRN

## 2014-06-03 NOTE — Progress Notes (Signed)
Subjective: Here for recheck. I saw her as a new patient in November 2015 for chronic back pain, chronic joint pains, joint stiffness in the wrist and hands, joint swelling.  She also has a history of COPD and tobacco use. She has a history of depression but hasn't had issues with depression quite some time for years.  At last visit we sent her for back x-ray, bilateral hand x-ray and rheumatoid screening labs. 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  Since last visit she feels like the Ultram has helped, still stays in pain all the time particularly when not moving. When she is at work at nighttime at [employer] she seems to do relatively okay.  She works 10 hour days. She walks up to 14 miles a day at work per her pedometer.   Recently pains include neck, back, hands, knees, feet hurt a lot.  Back pains, chronically.  Not sure if knees and feet contribute to back pain.  Hips don' t hurt since she has had bilat hip replacement.  Knees pains ongoing, worse being still for prolonged periods.   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  Has pain all the time in feet.  Feels pins and needle sensation in feet.   Can't go bear footed.  Has plantar fasciitis, worse in the left, has been doing tennis ball stretches, water bottle stretches.    Last blood tests for diabets and routine labs last year in [place], normal per her report.    Lives currently with oldest son who has a degree in exercise science and kneels or with stretching routine.    Regarding history of depression, a few years ago had nervous breakdown.   Was in ICU for 3 days, had amnesia.  After coming out of this hospitalization she changed things in her life and really improved on her stress and depression issues  Smoker, COPD, uses albuterol/neb occasionally.  Hx/ o PNA 15 years ago.  Smoker since  age 49yo, 1ppd.      ROS as in subjective  Objective: BP 110/80 mmHg  Pulse 88  Temp(Src) 97.5 F (36.4 C) (Oral)  Wt 224 lb (101.606 kg)  General appearance: alert, no distress, WD/WN, obese white female Neck: supple, no lymphadenopathy, no thyromegaly, no masses, tender bilat and somewhat posteriorly, mildly reduced ROM due to pain, no deformity,  Heart: RRR, normal S1, S2, no murmurs Lungs: decreased in general, no wheezes, rhonchi, or rales Abdomen: +bs, soft, non tender, non distended, no masses, no hepatomegaly, no splenomegaly Back:lumbar paraspinal tenderness, mild, but normal ROM, no obvious scoliosis. Musculoskeletal: long posterior hip surgical scars bilat, mild tenderness over knees bilat, tender bilat plantar fascia and volar feet in general, somewhat poor arches, but no swelling, no obvious deformity, otherwise legs nontender, no swelling, no obvious deformity Extremities: no edema, no cyanosis, no clubbing Pulses: 2+ symmetric, upper and lower extremities, normal cap refill Neurological: decreased sensation of volar feet bilat, otherwise alert, oriented x 3, CN2-12 intact, strength normal upper extremities and lower extremities, nonfocal exam Psychiatric: normal affect, behavior normal, pleasant    Assessment: Encounter Diagnoses  Name Primary?  . Chronic back pain Yes  . Neuropathy of both feet   . Smoker   . Polyarthralgia   . Plantar fasciitis, bilateral   . Obesity      Plan: We discussed her recent lumbar  spine x-ray, bilateral hand x-rays, rheumatoid screening labs.  We'll once again request prior records from orthopedics as well as her last routine blood work and visit from [place], [place].    We discussed the fact that although her back and hand x-rays weren't that significant she has had bilateral hip replacement and chronic pains in general.  Advised the need to try and lose weight and to cut back on tobacco.  We made the following recommendations and  medication changes today:  Patient Instructions  Recommendations  Begin gabapentin 300 mg once daily at your bedtime, for chronic pains in general and neuropathy  Begin mobile once daily for arthritis pain, joint pain  Take Ultram 1 tablet twice daily for pain  Occasionally if muscle spasms or and worse pain use Robaxin muscle relaxer  Try to start cutting back on tobacco  For plantar fasciitis, use a good supportive shoe with arch supports daily  Use a tennis ball massage and towel stretch every day before getting out of bed  Begin using plantar fasciitis foot splints at nighttime  Trying get exercise daily such as walking, stationary bike, lap swimming,  Try doing routine stretching such as a yoga class regularly  Recheck in 4-6 weeks  We'll try once again to get prior records    F/u in 4- 6wk.

## 2014-06-03 NOTE — Patient Instructions (Signed)
Recommendations  Begin gabapentin 300 mg once daily at your bedtime, for chronic pains in general and neuropathy  Begin mobile once daily for arthritis pain, joint pain  Take Ultram 1 tablet twice daily for pain  Occasionally if muscle spasms or and worse pain use Robaxin muscle relaxer  Try to start cutting back on tobacco  For plantar fasciitis, use a good supportive shoe with arch supports daily  Use a tennis ball massage and towel stretch every day before getting out of bed  Begin using plantar fasciitis foot splints at nighttime  Trying get exercise daily such as walking, stationary bike, lap swimming,  Try doing routine stretching such as a yoga class regularly  Recheck in 4-6 weeks  We'll try once again to get prior records

## 2014-06-30 ENCOUNTER — Other Ambulatory Visit: Payer: Self-pay | Admitting: Medical

## 2014-06-30 ENCOUNTER — Telehealth: Payer: Self-pay | Admitting: Medical

## 2014-06-30 NOTE — Telephone Encounter (Signed)
Pt needs refill Tramadol for arthritis pain, states been working 10 hour nights & over time and Rx didn't last as long.  Combination of the 3 meds are working well.

## 2014-06-30 NOTE — Telephone Encounter (Signed)
She shouldn't be completely out of medication until 07/04/14 so make sure she didn't use them up too fast.  Verify which 3 medications she is taking EVERY day?  I added Neurontin last visit which we can increase if needed.

## 2014-07-01 ENCOUNTER — Other Ambulatory Visit: Payer: Self-pay | Admitting: Medical

## 2014-07-01 MED ORDER — TRAMADOL HCL 50 MG PO TABS: 50.0000 mg | ORAL_TABLET | Freq: Two times a day (BID) | ORAL | Status: DC

## 2014-07-01 MED ORDER — MELOXICAM 15 MG PO TABS: 15.0000 mg | ORAL_TABLET | Freq: Every day | ORAL | Status: DC

## 2014-07-01 MED ORDER — GABAPENTIN 300 MG PO CAPS: 300.0000 mg | ORAL_CAPSULE | Freq: Three times a day (TID) | ORAL | Status: DC

## 2014-07-01 NOTE — Telephone Encounter (Signed)
Waiting on patient to pick up her Tramadol to go over this message with her

## 2014-07-01 NOTE — Telephone Encounter (Signed)
Patient states that she has been working 10 hour shifts and the Tramadol was for 2 x times a day. She said that she has had to use 3 most days. She said that she also takes Gabapetin and Meloxicam everyday and the combination of all 3 works well together. She states that she needs 3 Tramadol a day instead of 2. Please, advise

## 2014-07-01 NOTE — Telephone Encounter (Signed)
I just want to make sure she realizes we do not prescribe chronic narcotics for pain  Her listed diagnoses include chronic back pain, neuropathy of feet, polyarthralgias  If she doesn't tolerate the Neurontin/gabapentin more than once a day then I recommend we pursue either Lyrica or Cymbalta as an alternate medication.  The Ultram I would keep steady at twice daily for now and meloxicam once daily.    She can also use Robaxin at night as a muscle relaxer.  See if she is agreeable to this plan are see if she would like to change from Neurontin to something else.  If she feels like we need to go a different direction with further evaluation then we can certainly get an orthopedic doctor or neurologist involved along with other testing such as nerve conduction study or MRI

## 2014-07-01 NOTE — Telephone Encounter (Signed)
Patient is aware of Dorothea Ogle PA message in detail and the patient was not happy with the fact that Dorothea Ogle PA would not give her Ultram # 90 so could take 3 times a day. I explain to her that he doesn't feel comfortable prescribing at that dose. Patient states that she is probably going to look for a new doctor.

## 2014-07-01 NOTE — Telephone Encounter (Signed)
Based on those comments less make the following changes:   Continue meloxicam/Mobic once daily  Continue Ultram at twice a day, I don't feel comfortable increasing this  I sent gabapentin for 3 times a day but increase to twice a day for now  Is she still taking Robaxin, if so how is she using this?  Ultram printed,ready for pickup

## 2014-07-03 NOTE — Telephone Encounter (Signed)
I went over [employer] PA message in full detail and the patient states that she is not going to try any depression medication. She states that she has been through this type of thing before and it doesn't work for her. She states that her system is very sensitive to those types of medication. She said the only medication she would be willing to go back on is Celexa. She said she is working on upgrading her insurance so she can go see a Furniture conservator/restorer.

## 2014-07-17 ENCOUNTER — Telehealth: Payer: Self-pay | Admitting: Medical

## 2014-08-03 ENCOUNTER — Telehealth: Payer: Self-pay | Admitting: Medical

## 2014-08-03 ENCOUNTER — Other Ambulatory Visit: Payer: Self-pay | Admitting: Medical

## 2014-08-03 MED ORDER — MELOXICAM 15 MG PO TABS: 15.0000 mg | ORAL_TABLET | Freq: Every day | ORAL | Status: DC

## 2014-08-03 MED ORDER — TRAMADOL HCL 50 MG PO TABS: 50.0000 mg | ORAL_TABLET | Freq: Two times a day (BID) | ORAL | Status: DC

## 2014-08-03 MED ORDER — GABAPENTIN 300 MG PO CAPS: 300.0000 mg | ORAL_CAPSULE | Freq: Three times a day (TID) | ORAL | Status: DC

## 2014-08-03 MED ORDER — METHOCARBAMOL 500 MG PO TABS: 500.0000 mg | ORAL_TABLET | Freq: Three times a day (TID) | ORAL | Status: DC | PRN

## 2014-08-03 NOTE — Telephone Encounter (Signed)
Requesting refill on Tramadol 50mg.

## 2014-08-03 NOTE — Telephone Encounter (Signed)
I refilled the 4 medications I prescribe for her chronic pain

## 2014-08-19 NOTE — Telephone Encounter (Signed)
lm

## 2014-08-28 ENCOUNTER — Other Ambulatory Visit: Payer: Self-pay | Admitting: Family Medicine

## 2014-08-28 ENCOUNTER — Other Ambulatory Visit: Payer: Self-pay

## 2014-08-28 ENCOUNTER — Telehealth: Payer: Self-pay | Admitting: Medical

## 2014-08-28 MED ORDER — TRAMADOL HCL 50 MG PO TABS: 50.0000 mg | ORAL_TABLET | Freq: Two times a day (BID) | ORAL | Status: DC

## 2014-08-28 NOTE — Telephone Encounter (Signed)
Requesting refill on Tramadol 50mg . Pt need to pick up script today since she she is going out of town and she will be out of meds the beginning of the week while out of town. Call pt at [phone] when script ready for pick up

## 2014-08-28 NOTE — Telephone Encounter (Signed)
Called in tramadol per jcl 

## 2014-08-28 NOTE — Telephone Encounter (Signed)
Call this in 

## 2014-08-28 NOTE — Telephone Encounter (Signed)
done

## 2014-10-01 ENCOUNTER — Other Ambulatory Visit: Payer: Self-pay | Admitting: Medical

## 2014-10-01 NOTE — Telephone Encounter (Signed)
Pt wants refill Tramadol, pt states her last Rx was not filled until 09/01/14

## 2014-10-02 ENCOUNTER — Other Ambulatory Visit: Payer: Self-pay | Admitting: Medical

## 2014-10-02 ENCOUNTER — Telehealth: Payer: Self-pay | Admitting: Medical

## 2014-10-02 MED ORDER — TRAMADOL HCL 50 MG PO TABS: 50.0000 mg | ORAL_TABLET | Freq: Two times a day (BID) | ORAL | Status: DC

## 2014-10-02 NOTE — Telephone Encounter (Signed)
rx ready for pickup, but go ahead and schedule f/u for chronic pain.  Last visit was 05/2014.

## 2014-10-08 ENCOUNTER — Ambulatory Visit (INDEPENDENT_AMBULATORY_CARE_PROVIDER_SITE_OTHER): Payer: BLUE CROSS/BLUE SHIELD | Admitting: Medical

## 2014-10-08 DIAGNOSIS — G894 Chronic pain syndrome: Secondary | ICD-10-CM

## 2014-10-08 NOTE — Progress Notes (Signed)
This patient no showed for their appointment today.Which of the following is necessary for this patient.   A) No follow-up necessary   B) Follow-up urgent. Locate Patient Immediately.   C) Follow-up necessary. Contact patient and Schedule visit in ____ Days.   D) Follow-up Advised. Contact patient and Schedule visit in ____ Days. 

## 2014-10-08 NOTE — Telephone Encounter (Signed)
This patient no showed for their appointment today.Which of the following is necessary for this patient.   A) No follow-up necessary   B) Follow-up urgent. Locate Patient Immediately.   C) Follow-up necessary. Contact patient and Schedule visit in ____ Days.   D) Follow-up Advised. Contact patient and Schedule visit in ____ Days. 

## 2014-10-08 NOTE — Telephone Encounter (Signed)
C, but how many no shows has she done

## 2014-10-09 ENCOUNTER — Encounter: Payer: Self-pay | Admitting: Medical

## 2014-10-09 NOTE — Telephone Encounter (Signed)
This pt has never had a no show with [place] before. I am sending the first letter to her today.

## 2014-10-09 NOTE — Telephone Encounter (Signed)
ok 

## 2014-10-20 ENCOUNTER — Telehealth: Payer: Self-pay | Admitting: Medical

## 2014-10-20 NOTE — Telephone Encounter (Signed)
No show letter returned 10/20/2014

## 2014-10-27 ENCOUNTER — Ambulatory Visit (INDEPENDENT_AMBULATORY_CARE_PROVIDER_SITE_OTHER): Payer: BLUE CROSS/BLUE SHIELD | Admitting: Medical

## 2014-10-27 ENCOUNTER — Encounter: Payer: Self-pay | Admitting: Medical

## 2014-10-27 VITALS — BP 102/80 | HR 108 | Temp 99.1°F | Resp 15 | Wt 238.0 lb

## 2014-10-27 DIAGNOSIS — M1990 Unspecified osteoarthritis, unspecified site: Secondary | ICD-10-CM

## 2014-10-27 DIAGNOSIS — M79643 Pain in unspecified hand: Secondary | ICD-10-CM

## 2014-10-27 DIAGNOSIS — G8929 Other chronic pain: Secondary | ICD-10-CM

## 2014-10-27 DIAGNOSIS — R635 Abnormal weight gain: Secondary | ICD-10-CM

## 2014-10-27 DIAGNOSIS — G894 Chronic pain syndrome: Secondary | ICD-10-CM

## 2014-10-27 DIAGNOSIS — Z720 Tobacco use: Secondary | ICD-10-CM | POA: Diagnosis not present

## 2014-10-27 DIAGNOSIS — M549 Dorsalgia, unspecified: Secondary | ICD-10-CM | POA: Diagnosis not present

## 2014-10-27 DIAGNOSIS — F17200 Nicotine dependence, unspecified, uncomplicated: Secondary | ICD-10-CM

## 2014-10-27 DIAGNOSIS — J449 Chronic obstructive pulmonary disease, unspecified: Secondary | ICD-10-CM

## 2014-10-27 DIAGNOSIS — M19049 Primary osteoarthritis, unspecified hand: Secondary | ICD-10-CM

## 2014-10-27 LAB — POCT URINALYSIS DIPSTICK
Blood, UA: NEGATIVE
Glucose, UA: NEGATIVE
Leukocytes, UA: NEGATIVE
Nitrite, UA: NEGATIVE
Spec Grav, UA: 1.025
UROBILINOGEN UA: 1
pH, UA: 6

## 2014-10-27 LAB — POCT URINE PREGNANCY: PREG TEST UR: NEGATIVE

## 2014-10-27 MED ORDER — TRAMADOL HCL 50 MG PO TABS: ORAL_TABLET | ORAL | Status: DC

## 2014-10-27 MED ORDER — GABAPENTIN 300 MG PO CAPS: 300.0000 mg | ORAL_CAPSULE | Freq: Three times a day (TID) | ORAL | Status: DC

## 2014-10-27 MED ORDER — FLUTICASONE FUROATE-VILANTEROL 100-25 MCG/INH IN AEPB: 1.0000 | INHALATION_SPRAY | Freq: Every day | RESPIRATORY_TRACT | Status: DC

## 2014-10-27 MED ORDER — ALBUTEROL SULFATE 108 (90 BASE) MCG/ACT IN AEPB: 2.0000 | INHALATION_SPRAY | Freq: Four times a day (QID) | RESPIRATORY_TRACT | Status: DC | PRN

## 2014-10-27 MED ORDER — METHOCARBAMOL 500 MG PO TABS: 500.0000 mg | ORAL_TABLET | Freq: Three times a day (TID) | ORAL | Status: DC | PRN

## 2014-10-27 MED ORDER — MONTELUKAST SODIUM 10 MG PO TABS: 10.0000 mg | ORAL_TABLET | Freq: Every day | ORAL | Status: DC

## 2014-10-27 MED ORDER — NABUMETONE 500 MG PO TABS: 500.0000 mg | ORAL_TABLET | Freq: Two times a day (BID) | ORAL | Status: DC

## 2014-10-27 NOTE — Patient Instructions (Signed)
Call insurer about copay and coverage to go see rheumatology  We made the following recommendations and medication changes today regarding pain:  Continue gabapentin but go to 300mg  three times daily.   Increase Ultram to 1-2 tablets BID (50-100mg ) up to twice daily  Change from Mobic to Relafen which she tolerated better in the past, this is twice daily  Continue Robaxin muscle relaxer as needed up to twice daily  Begin Capsaicin cream topically for hands  COPD/asthma  Stop smoking!  Begin Breo once daily controller inhaler.  Rinse your mouth out after each use  Use the Albuterol rescue inhaler as needed, 2 puffs every 6 hours  Continue Singulair once daily  Recheck 1 month  Consider a physical for your next appointment with fasting labs.

## 2014-10-27 NOTE — Progress Notes (Signed)
Subjective: Here for recheck on pain.  Has limited insurance plan, says she cant see a specialist, and wants to for her arthritis and pains.   Exercise  - walking at work, some stretching.  Takes 2-3 hot showers daily.  Stretches throughout the night.  Diet - she notes that she has gotten lazy.  Went 2 years without drinking soda, and recently started back soda.   Has gained 30lb since 03/2014.   LMP 3 - 4 months ago.  Over the last year, periods average every few months, thinks she is going through the changes.  Has hot flashes.   Pain 7/10 average, sometimes gets 10/10 pain.  Rainy weather certainly affects her arthritis in her back and hands.    Hx/o COPD/asthma.  Has rescue inhaler.  Uses daily, been borrowing a friends.   Smoking 1 ppd.   Works 3rd shift at [employer], works 10 hours nightly.  Has been on Adviar several years ago.    Hx/o depression. Overall doing fine of late.   Mom died about 1.5 year ago, but seems to be doing ok currently.  I saw her as a new patient in November 2015 for chronic back pain, chronic joint pains, joint stiffness in the wrist and hands, joint swelling.  She also has a history of COPD and tobacco use. She has a history of depression but hasn't had issues with depression quite some time for years.  At last visit we sent her for back x-ray, bilateral hand x-ray and rheumatoid screening labs. 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  Gets pains in neck, back, hands, knees, feet hurt a lot.  Back pains, chronically.  Not sure if knees and feet contribute to back pain.  Hips don' t hurt since she has had bilat hip replacement.  Knees pains ongoing, worse being still for prolonged periods.   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  Has pain all the time in feet.  Feels pins and needle sensation in feet.   Can't go bear footed.   Has plantar fasciitis, worse in the left, has been doing tennis ball stretches, water bottle stretches.    Lives currently with oldest son who has a degree in exercise science and kneels or with stretching routine.    ROS as in subjective  Objective: BP 102/80 mmHg  Pulse 108  Temp(Src) 99.1 F (37.3 C) (Oral)  Resp 15  Wt 238 lb (107.956 kg)   Wt Readings from Last 3 Encounters:  10/27/14 238 lb (107.956 kg)  06/03/14 224 lb (101.606 kg)  04/07/14 209 lb (94.802 kg)    General appearance: alert, no distress, WD/WN, obese white female, weight gain noted Skin: heavily tanned today Neck: supple, no lymphadenopathy, no thyromegaly, no masses, tender bilat and somewhat posteriorly, mildly reduced ROM due to pain, no deformity,  Heart: RRR, normal S1, S2, no murmurs Lungs: decreased in general, no wheezes, rhonchi, or rales Abdomen: +bs, soft, non tender, non distended, no masses, no hepatomegaly, no splenomegaly Back:lumbar paraspinal tenderness, mild, but normal ROM, no obvious scoliosis. Musculoskeletal: long posterior hip surgical scars bilat, mild tenderness over knees bilat, tender bilat plantar fascia and volar feet in general, somewhat poor arches, but no swelling, no obvious deformity, otherwise legs nontender, no swelling, no obvious deformity Extremities: no edema, no cyanosis, no clubbing Pulses: 2+ symmetric, upper and lower extremities,  normal cap refill Neurological: decreased sensation of volar feet bilat, otherwise alert, oriented x 3, CN2-12 intact, strength normal upper extremities and lower extremities, nonfocal exam Psychiatric: normal affect, behavior normal, pleasant    Assessment: Encounter Diagnoses  Name Primary?  Marland Kitchen COPD with asthma Yes  . Smoker   . Weight gain   . Chronic pain syndrome   . Arthritis pain of hand   . Chronic back pain      Plan: We discussed her recent lumbar spine x-ray, bilateral hand x-rays, rheumatoid screening labs.   Advised  the need to try and lose weight and to STOP tobacco.  Spirometry today reviewed.  UA reviewed today,  Urine pregnancy negative today.  We made the following recommendations and medication changes today:  C/t gabapentin but go to 300mg  TID.  She has only been using it BID  Increase Ultram to 1-2 tablets BID (50-100mg )  Change from Mobic to Relafen which she tolerated better in the past  C/t Robaxin prn  Begin Capsacian cream topically for hands  COPD/asthma  Stop smoking!  Begin Breo once daily controller inhaler  Albuterol prn  Discussed proper use of medications, risks/benefits of each inhaler  C/t Singulair  Recheck 71mo   Cailin was seen today for follow-up.  Diagnoses and all orders for this visit:  COPD with asthma Orders: -     Cancel: Spirometry with Graph; Future -     Cancel: Spirometry with graph; Future  Smoker  Weight gain Orders: -     POCT urinalysis dipstick -     POCT urine pregnancy  Chronic pain syndrome  Arthritis pain of hand  Chronic back pain Orders: -     POCT urinalysis dipstick  Other orders -     montelukast (SINGULAIR) 10 MG tablet; Take 1 tablet (10 mg total) by mouth at bedtime. -     nabumetone (RELAFEN) 500 MG tablet; Take 1 tablet (500 mg total) by mouth 2 (two) times daily. -     gabapentin (NEURONTIN) 300 MG capsule; Take 1 capsule (300 mg total) by mouth 3 (three) times daily. -     methocarbamol (ROBAXIN) 500 MG tablet; Take 1 tablet (500 mg total) by mouth every 8 (eight) hours as needed for muscle spasms. -     traMADol (ULTRAM) 50 MG tablet; 1-2 tablets BID for pain -     Albuterol Sulfate (PROAIR RESPICLICK) 883 (90 BASE) MCG/ACT AEPB; Inhale 2 puffs into the lungs every 6 (six) hours as needed. -     Fluticasone Furoate-Vilanterol (BREO ELLIPTA) 100-25 MCG/INH AEPB; Inhale 1 puff into the lungs daily.  F/u in 1-2 months

## 2014-10-30 ENCOUNTER — Telehealth: Payer: Self-pay | Admitting: Medical

## 2014-10-30 NOTE — Telephone Encounter (Signed)
Pt. Called in requesting that she would like the antibiotic that she and shane discussed at her last OV(10-27-14). Pt. Does not recall the name of the antibiotic. Pt. Also states that at her last OV she had a small knot/boil under her rt. Arm at the time. Pt. States at the time it was about the size of a quarter. Pt. States that since the last visit it has grown much bigger and it is very painful. Pt. Stated this is the reason she is now requesting that the antibiotic be called in as soon as possible.

## 2014-11-02 ENCOUNTER — Other Ambulatory Visit: Payer: Self-pay | Admitting: Medical

## 2014-11-02 MED ORDER — CEPHALEXIN 500 MG PO CAPS: 500.0000 mg | ORAL_CAPSULE | Freq: Three times a day (TID) | ORAL | Status: DC

## 2014-11-02 NOTE — Telephone Encounter (Signed)
Keflex antibiotic sent.  Not sure why this didn't go electronically, but it has been sent.

## 2014-11-12 ENCOUNTER — Other Ambulatory Visit: Payer: Self-pay | Admitting: Medical

## 2014-11-12 ENCOUNTER — Telehealth: Payer: Self-pay | Admitting: Medical

## 2014-11-12 MED ORDER — CEPHALEXIN 500 MG PO CAPS: ORAL_CAPSULE | ORAL | Status: DC

## 2014-11-12 NOTE — Telephone Encounter (Signed)
Med sent.

## 2014-11-12 NOTE — Telephone Encounter (Signed)
Pt states all boils are gone except 1, it's about size of golf ball, wants another round of antibiotic sent to [facility] [address]

## 2014-11-27 ENCOUNTER — Other Ambulatory Visit: Payer: Self-pay | Admitting: Medical

## 2014-11-27 ENCOUNTER — Telehealth: Payer: Self-pay | Admitting: Medical

## 2014-11-27 MED ORDER — TRAMADOL HCL 50 MG PO TABS
ORAL_TABLET | ORAL | Status: DC
Start: 2014-11-27 — End: 2015-01-22

## 2014-11-27 MED ORDER — TRAMADOL HCL 50 MG PO TABS: ORAL_TABLET | ORAL | Status: DC

## 2014-11-27 NOTE — Telephone Encounter (Signed)
Pt called for refills of Tramadol. Pt uses [facility] on [address]. She can be reached at  [phone].

## 2014-11-27 NOTE — Telephone Encounter (Signed)
Script ready x 2.  Make f/u 10mo after last visit (check dates).  If we haven't done physical, lets make this a physical visit.

## 2014-11-27 NOTE — Telephone Encounter (Signed)
PT informed rx is ready.pt already has appt scheduled.

## 2015-01-15 ENCOUNTER — Other Ambulatory Visit: Payer: Self-pay | Admitting: Medical

## 2015-01-18 ENCOUNTER — Telehealth: Payer: Self-pay | Admitting: Family Medicine

## 2015-01-22 ENCOUNTER — Telehealth: Payer: Self-pay

## 2015-01-22 MED ORDER — TRAMADOL HCL 50 MG PO TABS: ORAL_TABLET | ORAL | Status: DC

## 2015-01-22 NOTE — Telephone Encounter (Signed)
Called Tramadol in per [place], CPE scheduled for Thursday

## 2015-01-22 NOTE — Telephone Encounter (Signed)
Pt didn't realize she would be out of her Tramadol and needs a refill ASAP

## 2015-01-26 ENCOUNTER — Other Ambulatory Visit: Payer: Self-pay | Admitting: Medical

## 2015-01-28 ENCOUNTER — Encounter: Payer: Self-pay | Admitting: Medical

## 2015-01-28 NOTE — Telephone Encounter (Signed)
This patient no showed for their appointment today.Which of the following is necessary for this patient.   A) No follow-up necessary Linda Chang PATIENT CALLED AT 8:40 AND SAID RIDE HAD NOT SHOWN UP.  B) Follow-up urgent. Locate Patient Immediately.   C) Follow-up necessary. Contact patient and Schedule visit in ____ Days.   D) Follow-up Advised. Contact patient and Schedule visit in ____ Days.

## 2015-01-29 ENCOUNTER — Telehealth: Payer: Self-pay | Admitting: Medical

## 2015-02-01 ENCOUNTER — Encounter: Payer: Self-pay | Admitting: Medical

## 2015-02-08 ENCOUNTER — Telehealth: Payer: Self-pay | Admitting: Medical

## 2015-02-08 NOTE — Telephone Encounter (Signed)
Pt's no show letter dated 02/01/2015 was returned via [place] mail as not deliverable.

## 2015-02-11 ENCOUNTER — Encounter: Payer: BLUE CROSS/BLUE SHIELD | Admitting: Medical

## 2015-02-11 ENCOUNTER — Telehealth: Payer: Self-pay

## 2015-02-11 NOTE — Telephone Encounter (Signed)
This patient no showed for their appointment today.Which of the following is necessary for this patient.   A) No follow-up necessary   B) Follow-up urgent. Locate Patient Immediately.   C) Follow-up necessary. Contact patient and Schedule visit in ____ Days.   D) Follow-up Advised. Contact patient and Schedule visit in ____ Days. 

## 2015-02-11 NOTE — Telephone Encounter (Signed)
i believe this is #2 or #3 no show, so unfortunately if she can't call ahead of time to cancel or rescheduled, then its time to dismiss.

## 2015-02-23 ENCOUNTER — Encounter: Payer: Self-pay | Admitting: Family Medicine

## 2015-02-23 ENCOUNTER — Telehealth: Payer: Self-pay | Admitting: Family Medicine

## 2015-02-23 ENCOUNTER — Telehealth: Payer: Self-pay | Admitting: Medical

## 2015-02-23 NOTE — Telephone Encounter (Signed)
ALERT PT HAS NEW PHARMACY.Pt called for refill of Tramadol. Please send to new pharmacy, [facility] on [address].

## 2015-02-23 NOTE — Telephone Encounter (Signed)
Check with Beverlee Nims as I believe we dismissed her due to multiple no shows.

## 2015-02-23 NOTE — Telephone Encounter (Signed)
Pt called for status of Tramadol and make sure we were sending to [facility] on [address].  I advised Audelia Acton has already left for the day and not sure that he is going to refill since she did not show up for her last two scheduled cpe and would let her know in the morning if he was going to refill and if he was going to continue seeing her.  Pt ph and address are correct.

## 2015-02-23 NOTE — Telephone Encounter (Signed)
Linda Chang, she had not been dismissed, however, she no showed again on her 2nd CPE, so I will send out dismissal letter today.

## 2015-02-23 NOTE — Telephone Encounter (Signed)
Controlled substances, particularly pain medications are strictly monitored by the FDA.  As a comparison, pain clinics would kick a patient out if 1 no show.   Most pain clinics make patients come in monthly for this type of medication.   So keeping in line with standard of care, it would not be appropriate for me to refill a controlled substance for any patient missing a regularly scheduled visit, not to mention 2 no shows .   At this point, the norm would be to charge her a $50 no show fee before we would even take her back.    So I can't in good faith refill this medication at this time.

## 2015-02-24 ENCOUNTER — Telehealth: Payer: Self-pay | Admitting: Family Medicine

## 2015-02-24 NOTE — Telephone Encounter (Signed)
I called pt back and reached her ans machine.  It will not accept any message.  Linda Chang is not going to refill Tramadol and we are dismissing patient.

## 2015-02-24 NOTE — Telephone Encounter (Signed)
Ret call to pt advised we are not going to be able to refill the Tramadol and since she is not showing up for her appts we will not be able to see her.

## 2015-02-25 NOTE — Telephone Encounter (Signed)
02/25/2015 °

## 2015-03-03 ENCOUNTER — Telehealth: Payer: Self-pay

## 2015-03-03 NOTE — Telephone Encounter (Signed)
Medical Records sent out to [facility] [place] [place] on 03/03/15

## 2015-06-08 ENCOUNTER — Other Ambulatory Visit: Payer: Self-pay | Admitting: Medical

## 2015-06-08 ENCOUNTER — Other Ambulatory Visit: Payer: Self-pay | Admitting: Family Medicine

## 2015-06-08 NOTE — Telephone Encounter (Signed)
Is this ok to refill?  

## 2015-07-06 ENCOUNTER — Other Ambulatory Visit: Payer: Self-pay | Admitting: Medical

## 2015-08-13 ENCOUNTER — Emergency Department (HOSPITAL_COMMUNITY)
Admission: EM | Admit: 2015-08-13 | Discharge: 2015-08-13 | Disposition: A | Discharge status: Home / Self Care | Payer: BLUE CROSS/BLUE SHIELD | Attending: Emergency Medicine | Admitting: Emergency Medicine

## 2015-08-13 ENCOUNTER — Encounter (HOSPITAL_COMMUNITY): Payer: Self-pay | Admitting: Emergency Medicine

## 2015-08-13 DIAGNOSIS — M5442 Lumbago with sciatica, left side: Secondary | ICD-10-CM | POA: Diagnosis not present

## 2015-08-13 DIAGNOSIS — F329 Major depressive disorder, single episode, unspecified: Secondary | ICD-10-CM | POA: Insufficient documentation

## 2015-08-13 DIAGNOSIS — J449 Chronic obstructive pulmonary disease, unspecified: Secondary | ICD-10-CM | POA: Insufficient documentation

## 2015-08-13 DIAGNOSIS — F419 Anxiety disorder, unspecified: Secondary | ICD-10-CM | POA: Diagnosis not present

## 2015-08-13 DIAGNOSIS — Z79899 Other long term (current) drug therapy: Secondary | ICD-10-CM | POA: Diagnosis not present

## 2015-08-13 DIAGNOSIS — G8929 Other chronic pain: Secondary | ICD-10-CM | POA: Insufficient documentation

## 2015-08-13 DIAGNOSIS — Z9660 Presence of unspecified orthopedic joint implant: Secondary | ICD-10-CM | POA: Diagnosis not present

## 2015-08-13 DIAGNOSIS — M1990 Unspecified osteoarthritis, unspecified site: Secondary | ICD-10-CM | POA: Diagnosis not present

## 2015-08-13 DIAGNOSIS — M549 Dorsalgia, unspecified: Secondary | ICD-10-CM | POA: Diagnosis present

## 2015-08-13 DIAGNOSIS — F17210 Nicotine dependence, cigarettes, uncomplicated: Secondary | ICD-10-CM | POA: Insufficient documentation

## 2015-08-13 MED ORDER — TRAMADOL HCL 50 MG PO TABS: ORAL_TABLET | ORAL | Status: DC

## 2015-08-13 NOTE — Discharge Instructions (Signed)
Please read and follow all provided instructions.  Your diagnoses today include:  1. Left-sided low back pain with left-sided sciatica    Tests performed today include:  Vital signs - see below for your results today  Medications prescribed:   Take any prescribed medications only as directed.  Home care instructions:   Follow any educational materials contained in this packet  Please rest, use ice or heat on your back for the next several days  Do not lift, push, pull anything more than 10 pounds for the next week  Follow-up instructions: Please follow-up with your primary care provider in the next 1 week for further evaluation of your symptoms.   Return instructions:  SEEK IMMEDIATE MEDICAL ATTENTION IF YOU HAVE:  New numbness, tingling, weakness, or problem with the use of your arms or legs  Severe back pain not relieved with medications  Loss control of your bowels or bladder  Increasing pain in any areas of the body (such as chest or abdominal pain)  Shortness of breath, dizziness, or fainting.   Worsening nausea (feeling sick to your stomach), vomiting, fever, or sweats  Any other emergent concerns regarding your health   Additional Information:  Your vital signs today were: BP 134/86 mmHg   Pulse 85   Temp(Src) 97.4 F (36.3 C) (Oral)   Resp 18   Ht 5\' 7"  (1.702 m)   Wt 97.523 kg   BMI 33.67 kg/m2   SpO2 99% If your blood pressure (BP) was elevated above 135/85 this visit, please have this repeated by your doctor within one month. --------------

## 2015-08-13 NOTE — ED Notes (Signed)
Patient states chronic back pain x 15 years.   Patient states has had trouble with her back since bilateral hip replacement.   Patient states that her primary has referred her to a pain clinic that she is scheduled to go see next week.   Patient states that her primary quit prescribing her tramadol.   Patient states she needs pain medicine to bridge her to pain clinic.

## 2015-08-13 NOTE — ED Provider Notes (Signed)
CSN: [id number]     Arrival date & time 08/13/15  [id number] History  By signing my name below, I, Doran Stabler, attest that this documentation has been prepared under the direction and in the presence of Shary Decamp, PA-C. Electronically Signed: Doran Stabler, ED Scribe. 08/13/2015. 9:09 AM.    Chief Complaint  Patient presents with  . Back Pain   The history is provided by the patient. No language interpreter was used.   HPI Comments: Linda Chang is a 50 y.o. female who presents to the Emergency Department with a PMHx of chronic back pain complaining of constant back pain for the past several years. Pt also reports HA and nausea. Pt rates her pain a 9/10. She has tried "[employer] and Tylenol with no relief. Pt reports that she had a bilateral hip replacement 15 years ago and since then she has been having these pains. She states she has an appointment with the "pain clinic" next Thursday and would like Tramadol until her appointment. Pt has tried muscle relaxers in the past but prefers Tramadol. Pt denies any bowel or bladder incontinence, numbness, paresthesias, fever, CP, abd pain, or any other symptoms at this time.   Past Medical History  Diagnosis Date  . Arthritis   . Asthma   . COPD (chronic obstructive pulmonary disease) ([place])   . Chronic back pain   . Chronic neck pain   . Polyarthralgia   . Morning joint stiffness   . Finger joint swelling   . Tobacco use   . Anxiety   . Depression     prior hospitalization   . S/P bilateral hip replacements 2004, 2003   Past Surgical History  Procedure Laterality Date  . Total hip arthroplasty Bilateral   . Tonsillectomy     No family history on file. Social History  Substance Use Topics  . Smoking status: Current Every Day Smoker -- 1.00 packs/day for 30 years    Types: Cigarettes  . Smokeless tobacco: None  . Alcohol Use: No     Comment: hx/o heavier ETOH use as teen   OB History    No data available     Review of  Systems A complete 10 system review of systems was obtained and all systems are negative except as noted in the HPI and PMH.   Allergies  Review of patient's allergies indicates no known allergies.  Home Medications   Prior to Admission medications   Medication Sig Start Date End Date Taking? Authorizing Provider  gabapentin (NEURONTIN) 300 MG capsule TAKE 1 CAPSULE(300 MG) BY MOUTH THREE TIMES DAILY 01/26/15  Yes Denita Lung, MD  montelukast (SINGULAIR) 10 MG tablet Take 1 tablet (10 mg total) by mouth at bedtime. 10/27/14  Yes Camelia Eng Tysinger, PA-C  cephALEXin (KEFLEX) 500 MG capsule BID x 7 days, then once daily until done 11/12/14   Camelia Eng Tysinger, PA-C  Fluticasone Furoate-Vilanterol (BREO ELLIPTA) 100-25 MCG/INH AEPB Inhale 1 puff into the lungs daily. 10/27/14   Camelia Eng Tysinger, PA-C  methocarbamol (ROBAXIN) 500 MG tablet Take 1 tablet (500 mg total) by mouth every 8 (eight) hours as needed for muscle spasms. 10/27/14   Camelia Eng Tysinger, PA-C  nabumetone (RELAFEN) 500 MG tablet Take 1 tablet (500 mg total) by mouth 2 (two) times daily. 10/27/14   Camelia Eng Tysinger, PA-C  PROAIR RESPICLICK 123XX123 (90 Base) MCG/ACT AEPB INHALE 2 PUFFS BY MOUTH EVERY 6 HOURS AS NEEDED 07/06/15   Denita Lung, MD  traMADol (ULTRAM) 50 MG tablet 1-2 tablets BID for pain 01/22/15   Camelia Eng Tysinger, PA-C   BP 134/86 mmHg  Pulse 85  Temp(Src) 97.4 F (36.3 C) (Oral)  Resp 18  Ht 5\' 7"  (1.702 m)  Wt 215 lb (97.523 kg)  BMI 33.67 kg/m2  SpO2 99%   Physical Exam  Constitutional: She is oriented to person, place, and time. She appears well-developed and well-nourished.  HENT:  Head: Normocephalic and atraumatic.  Eyes: EOM are normal. Pupils are equal, round, and reactive to light.  Neck: Normal range of motion. Neck supple. No tracheal deviation present.  Cardiovascular: Normal rate and normal heart sounds.   Pulmonary/Chest: Effort normal and breath sounds normal. No respiratory distress. She has no wheezes.  She has no rales. She exhibits no tenderness.  Abdominal: Soft. She exhibits no distension.  Musculoskeletal:       Lumbar back: Normal. She exhibits normal range of motion, no tenderness, no bony tenderness, no swelling, no deformity, no laceration, no pain and no spasm.  Neurological: She is alert and oriented to person, place, and time.  Cranial Nerves:  II: Pupils equal, round, reactive to light III,IV, VI: ptosis not present, extra-ocular motions intact bilaterally  V,VII: smile symmetric, facial light touch sensation equal VIII: hearing grossly normal bilaterally  IX,X: midline uvula rise  XI: bilateral shoulder shrug equal and strong XII: midline tongue extension  Skin: Skin is warm and dry.  Psychiatric: She has a normal mood and affect.  Nursing note and vitals reviewed.  ED Course  Procedures  DIAGNOSTIC STUDIES: Oxygen Saturation is 99% on room air, normal by my interpretation.    COORDINATION OF CARE: 9:02 AM Discussed treatment plan with pt including Rx for Tramadol at bedside and pt agreed to plan.   MDM  I have reviewed the relevant previous healthcare records.  obtained HPI from historian.  ED Course:  Assessment: Patient with back pain. No neurological deficits appreciated. Patient is ambulatory. No warning symptoms of back pain including: fecal incontinence, urinary retention or overflow incontinence, night sweats, waking from sleep with back pain, unexplained fevers or weight loss, h/o cancer, IVDU, recent trauma. No concern for cauda equina, epidural abscess, or other serious cause of back pain. Conservative measures such as rest, ice/heat and pain medicine indicated with PCP follow-up if no improvement with conservative management. Has appointment with pain clinic next Wednesday for chronic issue. At time of discharge, Patient is in no acute distress. Vital Signs are stable. Patient is able to ambulate. Patient able to tolerate PO.   Disposition/Plan:  DC  Home Additional Verbal discharge instructions given and discussed with patient.  Pt Instructed to f/u with pain clinic Return precautions given Pt acknowledges and agrees with plan  Supervising Physician Fredia Sorrow, MD   Final diagnoses:  Left-sided low back pain with left-sided sciatica     I personally performed the services described in this documentation, which was scribed in my presence. The recorded information has been reviewed and is accurate.   Shary Decamp, PA-C 08/13/15 [place], MD 08/13/15 [phone]

## 2015-09-21 ENCOUNTER — Other Ambulatory Visit: Payer: Self-pay | Admitting: Family Medicine

## 2015-10-18 ENCOUNTER — Other Ambulatory Visit: Payer: Self-pay | Admitting: Medical

## 2016-03-25 ENCOUNTER — Emergency Department (HOSPITAL_COMMUNITY)
Admission: EM | Admit: 2016-03-25 | Discharge: 2016-03-25 | Disposition: A | Discharge status: Home / Self Care | Payer: BLUE CROSS/BLUE SHIELD | Attending: Emergency Medicine | Admitting: Emergency Medicine

## 2016-03-25 ENCOUNTER — Encounter (HOSPITAL_COMMUNITY): Payer: Self-pay | Admitting: *Deleted

## 2016-03-25 DIAGNOSIS — F17210 Nicotine dependence, cigarettes, uncomplicated: Secondary | ICD-10-CM | POA: Insufficient documentation

## 2016-03-25 DIAGNOSIS — Z96643 Presence of artificial hip joint, bilateral: Secondary | ICD-10-CM | POA: Diagnosis not present

## 2016-03-25 DIAGNOSIS — J449 Chronic obstructive pulmonary disease, unspecified: Secondary | ICD-10-CM | POA: Diagnosis not present

## 2016-03-25 DIAGNOSIS — J45909 Unspecified asthma, uncomplicated: Secondary | ICD-10-CM | POA: Insufficient documentation

## 2016-03-25 DIAGNOSIS — M5442 Lumbago with sciatica, left side: Secondary | ICD-10-CM

## 2016-03-25 DIAGNOSIS — M62830 Muscle spasm of back: Secondary | ICD-10-CM | POA: Diagnosis not present

## 2016-03-25 DIAGNOSIS — M545 Low back pain: Secondary | ICD-10-CM | POA: Diagnosis present

## 2016-03-25 DIAGNOSIS — G8929 Other chronic pain: Secondary | ICD-10-CM | POA: Diagnosis not present

## 2016-03-25 MED ORDER — BUPIVACAINE HCL 0.25 % IJ SOLN
5.0000 mL | Freq: Once | INTRAMUSCULAR | Status: DC
  Filled 2016-03-25: qty 5

## 2016-03-25 MED ORDER — BUPIVACAINE HCL (PF) 0.25 % IJ SOLN
5.0000 mL | Freq: Once | INTRAMUSCULAR | Status: AC
  Administered 2016-03-25: 5 mL
  Filled 2016-03-25: qty 10

## 2016-03-25 MED ORDER — KETOROLAC TROMETHAMINE 60 MG/2ML IM SOLN
60.0000 mg | Freq: Once | INTRAMUSCULAR | Status: AC
Start: 2016-03-25 — End: 2016-03-25
  Administered 2016-03-25: 60 mg via INTRAMUSCULAR
  Filled 2016-03-25: qty 2

## 2016-03-25 NOTE — Discharge Instructions (Signed)
1. Medications: usual home medications and therapies 2. Treatment: rest, drink plenty of fluids, gentle stretching as discussed, alternate ice and heat 3. Follow Up: Please followup with your primary doctor in 3 days for discussion of your diagnoses and further evaluation after today's visit; if you do not have a primary care doctor use the resource guide provided to find one;  Return to the ER for worsening back pain, difficulty walking, loss of bowel or bladder control or other concerning symptoms

## 2016-03-25 NOTE — ED Notes (Signed)
Updated on wait, lying in stretcher, NAD, calm, using cell phone, agreeable, verbalizes understanding.

## 2016-03-25 NOTE — ED Triage Notes (Signed)
The pt is c/o lower back pain for 2 days with pain radiating down both her legs.  She has chronic back pain and was going to a pain clinic until 2 months ago until her insurance stopped paying  She has taken percocet 10mg  tonight without relief

## 2016-03-25 NOTE — ED Provider Notes (Signed)
[name] Provider Note   CSN: [id number] Arrival date & time: 03/25/16  0240     History   Chief Complaint Chief Complaint  Patient presents with  . Back Pain    HPI Linda Chang is a 50 y.o. female with a hx of Anxiety, arthritis, asthma, chronic back pain, chronic neck pain, COPD, DDD presents to the Emergency Department complaining of gradual, persistent, progressively worsening acute exacerbation of her chronic back pain onset yesterday.  Pt reports she has been seeing a pain clinic, but stopped going 2 mos ago due to insurance.  She reports she was receiving epidural injections through this group and it has been 2-3 months since her last injection.  Pt has a hx of bilateral hip replacements.  She reports she has never had an MRI of her back.  Associated symptoms include radiation of the pain from her low back into the left buttock and then down the back of her left leg. She reports she normally takes hot baths, stretching which help her deal with the chronic pain. She reports she does not take the pain medication daily.  She reports taking percocet since yesterday without relief.  She reports normal urination.  She denies numbness in her legs.  She reports no gait disturbance.  Nothing makes it better and bending and moving makes it worse.  Pt denies fever, chills, headache, chest pain, SOB, abd pain, N/V/D, weakness, saddle anesthesia, loss of bowel of bladder control.    Pt denies IVDU, hx of cancer, anticoagulation.     The history is provided by the patient and medical records. No language interpreter was used.    Past Medical History:  Diagnosis Date  . Anxiety   . Arthritis   . Asthma   . Chronic back pain   . Chronic neck pain   . COPD (chronic obstructive pulmonary disease) ([place])   . Depression    prior hospitalization   . Finger joint swelling   . Morning joint stiffness   . Polyarthralgia   . S/P bilateral hip replacements 2004, 2003  . Tobacco use      There are no active problems to display for this patient.   Past Surgical History:  Procedure Laterality Date  . TONSILLECTOMY    . TOTAL HIP ARTHROPLASTY Bilateral     OB History    No data available       Home Medications    Prior to Admission medications   Not on File    Family History No family history on file.  Social History Social History  Substance Use Topics  . Smoking status: Current Every Day Smoker    Packs/day: 1.00    Years: 30.00    Types: Cigarettes  . Smokeless tobacco: Never Used  . Alcohol use No     Comment: hx/o heavier ETOH use as teen     Allergies   Morphine and related   Review of Systems Review of Systems  Musculoskeletal: Positive for back pain.  Neurological:       Paresthesia of the left leg  All other systems reviewed and are negative.    Physical Exam Updated Vital Signs BP 120/93 (BP Location: Right Arm)   Pulse 103   Temp 97.7 F (36.5 C) (Oral)   Resp 17   Ht 5\' 8"  (1.727 m)   Wt 101.3 kg   SpO2 98%   BMI 33.96 kg/m   Physical Exam  Constitutional: She appears well-developed and well-nourished. No distress.  HENT:  Head: Normocephalic and atraumatic.  Mouth/Throat: Oropharynx is clear and moist. No oropharyngeal exudate.  Eyes: Conjunctivae are normal.  Neck: Normal range of motion. Neck supple.  Full ROM without pain  Cardiovascular: Normal rate, regular rhythm and intact distal pulses.   Pulmonary/Chest: Effort normal and breath sounds normal. No respiratory distress. She has no wheezes.  Abdominal: Soft. She exhibits no distension. There is no tenderness.  Musculoskeletal:  Full range of motion of the T-spine and L-spine No midline tenderness to the  T-spine or L-spine Tenderness to palpation of the left paraspinous muscles of the L-spine over the SI joint with palpable muscle spasm  Lymphadenopathy:    She has no cervical adenopathy.  Neurological: She is alert. She has normal reflexes.   Reflex Scores:      Bicep reflexes are 2+ on the right side and 2+ on the left side.      Brachioradialis reflexes are 2+ on the right side and 2+ on the left side.      Patellar reflexes are 2+ on the right side and 2+ on the left side.      Achilles reflexes are 2+ on the right side and 2+ on the left side. Speech is clear and goal oriented, follows commands Normal 5/5 strength in upper and lower extremities bilaterally including dorsiflexion and plantar flexion, strong and equal grip strength Sensation normal to light and sharp touch Moves extremities without ataxia, coordination intact Antalgic gait Normal balance No Clonus  Skin: Skin is warm and dry. No rash noted. She is not diaphoretic. No erythema.  Psychiatric: She has a normal mood and affect. Her behavior is normal.  Nursing note and vitals reviewed.    ED Treatments / Results   Procedures Procedures (including critical care time)  Patient given 0.25% Marcaine injection in 1 trigger point of the left low back pain. Pt consents verbally to this.   Procedure: With alcohol prep of the l spine musculature.  Palpable firm area of tenderness consistent with trigger point is localized. It was approached with 25-gauge needle and 6 mL of Marcaine was injected.  Tolerated this well with immediate pain relief.  .   Medications Ordered in ED Medications  ketorolac (TORADOL) injection 60 mg (60 mg Intramuscular Given 03/25/16 0442)  bupivacaine (PF) (MARCAINE) 0.25 % injection 5 mL (5 mLs Infiltration Given by Other 03/25/16 [id number])     Initial Impression / Assessment and Plan / ED Course  I have reviewed the triage vital signs and the nursing notes.  Pertinent labs & imaging results that were available during my care of the patient were reviewed by me and considered in my medical decision making (see chart for details).  Clinical Course    Patient with back pain.  No neurological deficits and normal neuro exam.  Patient can  walk but states is painful.  No loss of bowel or bladder control.  No concern for cauda equina.  No fever, night sweats, weight loss, h/o cancer, IVDU.  Trigger point injection with almost complete resolution of pain.  Pt is ambulatory without difficulty at this time. RICE protocol and pain medicine indicated and discussed with patient.    Final Clinical Impressions(s) / ED Diagnoses   Final diagnoses:  Chronic left-sided low back pain with left-sided sciatica  Muscle spasm of back    New Prescriptions Current Discharge Medication List       Abigail Butts, PA-C 03/25/16 [id number]    Everlene Balls, MD 03/25/16 1409

## 2016-09-19 ENCOUNTER — Encounter (HOSPITAL_COMMUNITY): Payer: Self-pay | Admitting: Emergency Medicine

## 2016-09-19 ENCOUNTER — Emergency Department (HOSPITAL_COMMUNITY): Payer: Self-pay

## 2016-09-19 ENCOUNTER — Emergency Department (HOSPITAL_COMMUNITY)
Admission: EM | Admit: 2016-09-19 | Discharge: 2016-09-20 | Disposition: A | Discharge status: Home / Self Care | Payer: Self-pay | Attending: Emergency Medicine | Admitting: Emergency Medicine

## 2016-09-19 DIAGNOSIS — F17210 Nicotine dependence, cigarettes, uncomplicated: Secondary | ICD-10-CM | POA: Insufficient documentation

## 2016-09-19 DIAGNOSIS — J449 Chronic obstructive pulmonary disease, unspecified: Secondary | ICD-10-CM | POA: Insufficient documentation

## 2016-09-19 DIAGNOSIS — J0101 Acute recurrent maxillary sinusitis: Secondary | ICD-10-CM | POA: Insufficient documentation

## 2016-09-19 DIAGNOSIS — Z96641 Presence of right artificial hip joint: Secondary | ICD-10-CM | POA: Insufficient documentation

## 2016-09-19 DIAGNOSIS — Z96642 Presence of left artificial hip joint: Secondary | ICD-10-CM | POA: Insufficient documentation

## 2016-09-19 LAB — CBC
HCT: 36.2 % (ref 36.0–46.0)
Hemoglobin: 11.6 g/dL — ABNORMAL LOW (ref 12.0–15.0)
MCH: 28.5 pg (ref 26.0–34.0)
MCHC: 32 g/dL (ref 30.0–36.0)
MCV: 88.9 fL (ref 78.0–100.0)
PLATELETS: 253 10*3/uL (ref 150–400)
RBC: 4.07 MIL/uL (ref 3.87–5.11)
RDW: 15.1 % (ref 11.5–15.5)
WBC: 7 10*3/uL (ref 4.0–10.5)

## 2016-09-19 LAB — BASIC METABOLIC PANEL
ANION GAP: 6 (ref 5–15)
BUN: 11 mg/dL (ref 6–20)
CALCIUM: 8.6 mg/dL — AB (ref 8.9–10.3)
CO2: 33 mmol/L — ABNORMAL HIGH (ref 22–32)
Chloride: 95 mmol/L — ABNORMAL LOW (ref 101–111)
Creatinine, Ser: 0.96 mg/dL (ref 0.44–1.00)
GFR calc Af Amer: >60 mL/min (ref >60)
GLUCOSE: 93 mg/dL (ref 65–99)
Potassium: 2.8 mmol/L — ABNORMAL LOW (ref 3.5–5.1)
Sodium: 134 mmol/L — ABNORMAL LOW (ref 135–145)

## 2016-09-19 MED ORDER — DEXAMETHASONE SODIUM PHOSPHATE 10 MG/ML IJ SOLN
10.0000 mg | Freq: Once | INTRAMUSCULAR | Status: AC
  Administered 2016-09-19: 10 mg via INTRAVENOUS
  Filled 2016-09-19: qty 1

## 2016-09-19 MED ORDER — IBUPROFEN 600 MG PO TABS: 600.0000 mg | ORAL_TABLET | Freq: Three times a day (TID) | ORAL | 0 refills | Status: DC | PRN

## 2016-09-19 MED ORDER — HYDROCODONE-ACETAMINOPHEN 5-325 MG PO TABS
1.0000 | ORAL_TABLET | Freq: Four times a day (QID) | ORAL | 0 refills | Status: DC | PRN
Start: 2016-09-19 — End: 2017-03-11

## 2016-09-19 MED ORDER — IOPAMIDOL (ISOVUE-300) INJECTION 61%
INTRAVENOUS | Status: AC
  Administered 2016-09-19: 75 mL
  Filled 2016-09-19: qty 75

## 2016-09-19 MED ORDER — MAGNESIUM SULFATE 2 GM/50ML IV SOLN
2.0000 g | Freq: Once | INTRAVENOUS | Status: AC
  Administered 2016-09-19: 2 g via INTRAVENOUS
  Filled 2016-09-19: qty 50

## 2016-09-19 MED ORDER — FENTANYL CITRATE (PF) 100 MCG/2ML IJ SOLN
50.0000 ug | Freq: Once | INTRAMUSCULAR | Status: AC
  Administered 2016-09-19: 50 ug via INTRAVENOUS
  Filled 2016-09-19: qty 2

## 2016-09-19 MED ORDER — POTASSIUM CHLORIDE CRYS ER 20 MEQ PO TBCR
40.0000 meq | EXTENDED_RELEASE_TABLET | Freq: Once | ORAL | Status: AC
  Administered 2016-09-19: 40 meq via ORAL
  Filled 2016-09-19: qty 2

## 2016-09-19 MED ORDER — PIPERACILLIN-TAZOBACTAM 3.375 G IVPB 30 MIN
3.3750 g | Freq: Once | INTRAVENOUS | Status: AC
  Administered 2016-09-19: 3.375 g via INTRAVENOUS
  Filled 2016-09-19: qty 50

## 2016-09-19 MED ORDER — SODIUM CHLORIDE 0.9 % IV SOLN
30.0000 meq | Freq: Once | INTRAVENOUS | Status: AC
  Administered 2016-09-19: 30 meq via INTRAVENOUS
  Filled 2016-09-19: qty 15

## 2016-09-19 MED ORDER — PIPERACILLIN-TAZOBACTAM 4.5 G IVPB: 4.5000 g | Freq: Once | INTRAVENOUS | Status: DC

## 2016-09-19 MED ORDER — KETOROLAC TROMETHAMINE 15 MG/ML IJ SOLN
15.0000 mg | Freq: Once | INTRAMUSCULAR | Status: AC
  Administered 2016-09-19: 15 mg via INTRAVENOUS
  Filled 2016-09-19: qty 1

## 2016-09-19 MED ORDER — AMOXICILLIN-POT CLAVULANATE 875-125 MG PO TABS: 1.0000 | ORAL_TABLET | Freq: Two times a day (BID) | ORAL | 0 refills | Status: AC

## 2016-09-19 NOTE — ED Notes (Signed)
MD at bedside. 

## 2016-09-19 NOTE — ED Triage Notes (Signed)
Pt presents to ED for assessment of bilateral sinus pressure, starting 1 month ago with hx of sinus infections.  Patient states she has been using alka seltzer at home without relief.  Redness and swelling noted to bilateral face with severe pain and some clear drainage.

## 2016-09-19 NOTE — ED Provider Notes (Signed)
[facility] DEPT Provider Note   CSN: [id number] Arrival date & time: 09/19/16  1919     History   Chief Complaint Chief Complaint  Patient presents with  . Sinusitis    HPI Linda Chang is a 51 y.o. female.  HPI   51 year old female with past medical history of recurrent sinusitis here with severe facial pain and swelling. The patient states her symptoms started approximately 3-4 weeks ago. It began as an aching, throbbing frontal facial sensation with associated fullness in her face. Over the last week, she has had worsening pain as well as developed redness across her face. She has associated fevers but no chills. Denies any vision changes. No focal numbness or weakness. No headaches or neck stiffness. She has a history of recurrent sinus infections with similar symptoms in the past. No ear pain. Pain is worse with leaning forward. No specific alleviating factors.  Past Medical History:  Diagnosis Date  . Anxiety   . Arthritis   . Asthma   . Chronic back pain   . Chronic neck pain   . COPD (chronic obstructive pulmonary disease) ([place])   . Depression    prior hospitalization   . Finger joint swelling   . Morning joint stiffness   . Polyarthralgia   . S/P bilateral hip replacements 2004, 2003  . Tobacco use     There are no active problems to display for this patient.   Past Surgical History:  Procedure Laterality Date  . TONSILLECTOMY    . TOTAL HIP ARTHROPLASTY Bilateral     OB History    No data available       Home Medications    Prior to Admission medications   Not on File    Family History History reviewed. No pertinent family history.  Social History Social History  Substance Use Topics  . Smoking status: Current Every Day Smoker    Packs/day: 0.25    Years: 30.00    Types: Cigarettes  . Smokeless tobacco: Never Used  . Alcohol use No     Comment: hx/o heavier ETOH use as teen     Allergies   Morphine and related   Review of  Systems Review of Systems  Constitutional: Positive for fatigue. Negative for chills and fever.  HENT: Positive for congestion, facial swelling, rhinorrhea and sore throat.   Eyes: Negative for visual disturbance.  Respiratory: Negative for cough, shortness of breath and wheezing.   Cardiovascular: Negative for chest pain and leg swelling.  Gastrointestinal: Negative for abdominal pain, diarrhea, nausea and vomiting.  Genitourinary: Negative for dysuria and flank pain.  Musculoskeletal: Negative for neck pain and neck stiffness.  Skin: Negative for rash and wound.  Allergic/Immunologic: Negative for immunocompromised state.  Neurological: Negative for syncope, weakness and headaches.  All other systems reviewed and are negative.    Physical Exam Updated Vital Signs BP 128/82 (BP Location: Right Arm)   Pulse 86   Temp 98.4 F (36.9 C) (Oral)   Resp 16   Ht 5\' 7"  (1.702 m)   Wt 207 lb (93.9 kg)   SpO2 96%   BMI 32.42 kg/m   Physical Exam  Constitutional: She is oriented to person, place, and time. She appears well-developed and well-nourished. No distress.  HENT:  Head: Normocephalic and atraumatic.  Marketed bilateral maxillary sinus tenderness to percussion. Mild facial erythema overlying bilateral maxillary sinuses. No fluctuance. Bilateral nasal mucosal edema with purulent discharge. Tympanic membranes normal bilaterally. No external auditory canal abnormalities.  Eyes: Conjunctivae are normal.  Neck: Neck supple.  Cardiovascular: Normal rate, regular rhythm and normal heart sounds.  Exam reveals no friction rub.   No murmur heard. Pulmonary/Chest: Effort normal and breath sounds normal. No respiratory distress. She has no wheezes. She has no rales.  Abdominal: She exhibits no distension.  Musculoskeletal: She exhibits no edema.  Neurological: She is alert and oriented to person, place, and time. She exhibits normal muscle tone.  Skin: Skin is warm. Capillary refill takes  less than 2 seconds.  Psychiatric: She has a normal mood and affect.  Nursing note and vitals reviewed.    ED Treatments / Results  Labs (all labs ordered are listed, but only abnormal results are displayed) Labs Reviewed  CBC - Abnormal; Notable for the following:       Result Value   Hemoglobin 11.6 (*)    All other components within normal limits  BASIC METABOLIC PANEL - Abnormal; Notable for the following:    Sodium 134 (*)    Potassium 2.8 (*)    Chloride 95 (*)    CO2 33 (*)    Calcium 8.6 (*)    All other components within normal limits    EKG  EKG Interpretation None       Radiology Ct Maxillofacial W Contrast  Result Date: 09/19/2016 CLINICAL DATA:  Initial evaluation for acute sinus infection. EXAM: CT MAXILLOFACIAL WITH CONTRAST TECHNIQUE: Multidetector CT imaging of the maxillofacial structures was performed. Multiplanar CT image reconstructions were also generated. A small metallic BB was placed on the right temple in order to reliably differentiate right from left. COMPARISON:  None. FINDINGS: Osseous: No acute osseous abnormality within the face. Patient is edentulous. Orbits: Globes and orbital soft tissues within normal limits. Sinuses: Right maxillary sinus is completely opacified. Scattered hyperdensity within the sinus than favored to reflect desiccated secretions and/ or superimposed fungal elements. Slight expansion suggest mucocele formation. Right maxillary antrum and ostiomeatal unit are opacified. Anterior and posterior ethmoidal air cells are opacified. Right sphenoid sinus largely opacified with superimposed fluid level. Findings consistent with acute right paranasal sinusitis. Frontal sinuses are largely hypoplastic. No evidence for extension infection outside of the paranasal sinuses. Retro antral and premaxillary fat are preserved. Left-sided paranasal sinuses are clear. Nasal septum minimally bowed to the left. No mastoid effusion. Middle ear cavities  are clear. Soft tissues: No acute soft tissue abnormality within the visualize face. Salivary glands are normal. Parapharyngeal 5 preserved. No adenopathy within the visualized neck. Limited intracranial: Unremarkable. 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 No abscess or evidence for extension of infection into the adjacent right face. Electronically Signed   By: Jeannine Boga M.D.   On: 09/19/2016 22:59    Procedures Procedures (including critical care time)  Medications Ordered in ED Medications  potassium chloride SA (K-DUR,KLOR-CON) CR tablet 40 mEq (not administered)  potassium chloride 30 mEq in sodium chloride 0.9 % 265 mL (KCL MULTIRUN) IVPB (not administered)  magnesium sulfate IVPB 2 g 50 mL (not administered)  ketorolac (TORADOL) 15 MG/ML injection 15 mg (15 mg Intravenous Given 09/19/16 2147)  fentaNYL (SUBLIMAZE) injection 50 mcg (50 mcg Intravenous Given 09/19/16 2147)  piperacillin-tazobactam (ZOSYN) IVPB 3.375 g (3.375 g Intravenous New Bag/Given 09/19/16 2148)  iopamidol (ISOVUE-300) 61 % injection (75 mLs  Contrast Given 09/19/16 2212)     Initial Impression / Assessment and Plan / ED Course  I have reviewed the triage vital  signs and the nursing notes.  Pertinent labs & imaging results that were available during my care of the patient were reviewed by me and considered in my medical decision making (see chart for details).    51 year old female with past medical history of recurrent sinusitis here with bilateral facial pain and swelling in the setting of one month of nasal congestion. On arrival, vital signs are stable. She is afebrile. White count is normal. CT scan does show severe sinusitis with no evidence of bony invasion. She has no evidence of meningitis or encephalitis. Will place on Augmentin and discharge home with outpatient follow-up with ENT given recurrent  sinusitis, evidence of mucocele formation on CT. Pt in agreement with this plan. She does have hypokalemia on labs and has been repleted here with IV and PO K, also Mag. No arrhythmia. She has h/o same and will continue K supplementation.  ADDENDUM: PLEASE NOTE THAT LAB WORK ENTERED AT 3:53 AM WAS ENTERED IN ERROR ON THE WRONG PATIENT. THE PATIENT WAS DISCHARGED AT THAT TIME AND LAB WORK DID NOT BELONG TO THIS PATIENT.   Final Clinical Impressions(s) / ED Diagnoses   Final diagnoses:  Acute recurrent maxillary sinusitis    New Prescriptions Discharge Medication List as of 09/19/2016 11:56 PM    START taking these medications   Details  amoxicillin-clavulanate (AUGMENTIN) 875-125 MG tablet Take 1 tablet by mouth every 12 (twelve) hours., Starting Tue 09/19/2016, Until Fri 09/29/2016, Print    HYDROcodone-acetaminophen (NORCO/VICODIN) 5-325 MG tablet Take 1-2 tablets by mouth every 6 (six) hours as needed for severe pain., Starting Tue 09/19/2016, Print    ibuprofen (ADVIL,MOTRIN) 600 MG tablet Take 1 tablet (600 mg total) by mouth every 8 (eight) hours as needed., Starting Tue 09/19/2016, Print         Duffy Bruce, MD 09/20/16 1154

## 2016-09-20 LAB — I-STAT CG4 LACTIC ACID, ED

## 2016-09-20 LAB — I-STAT TROPONIN, ED: TROPONIN I, POC: 7.52 ng/mL — AB (ref 0.00–0.08)

## 2016-09-20 LAB — I-STAT VENOUS BLOOD GAS, ED
ACID-BASE DEFICIT: 27 mmol/L — AB (ref 0.0–2.0)
Bicarbonate: 6.3 mmol/L — ABNORMAL LOW (ref 20.0–28.0)
O2 SAT: 90 %
PCO2 VEN: 49.4 mmHg (ref 44.0–60.0)
TCO2: 8 mmol/L (ref 0–100)
pH, Ven: 6.711 — CL (ref 7.250–7.430)
pO2, Ven: 117 mmHg — ABNORMAL HIGH (ref 32.0–45.0)

## 2016-09-20 LAB — I-STAT CHEM 8, ED
BUN: 31 mg/dL — ABNORMAL HIGH (ref 6–20)
CREATININE: 1.7 mg/dL — AB (ref 0.44–1.00)
Calcium, Ion: 2.01 mmol/L (ref 1.15–1.40)
Chloride: 117 mmol/L — ABNORMAL HIGH (ref 101–111)
Glucose, Bld: 696 mg/dL (ref 65–99)
HCT: 21 % — ABNORMAL LOW (ref 36.0–46.0)
HEMOGLOBIN: 7.1 g/dL — AB (ref 12.0–15.0)
Potassium: 4.4 mmol/L (ref 3.5–5.1)
SODIUM: 145 mmol/L (ref 135–145)
TCO2: 10 mmol/L (ref 0–100)

## 2016-09-20 NOTE — ED Notes (Signed)
Pt departed in NAD, refused use of wheelchair.  

## 2017-03-11 ENCOUNTER — Emergency Department (HOSPITAL_COMMUNITY): Payer: Self-pay

## 2017-03-11 ENCOUNTER — Encounter (HOSPITAL_COMMUNITY): Payer: Self-pay | Admitting: Emergency Medicine

## 2017-03-11 ENCOUNTER — Emergency Department (HOSPITAL_COMMUNITY)
Admission: EM | Admit: 2017-03-11 | Discharge: 2017-03-11 | Disposition: A | Discharge status: Home / Self Care | Payer: Self-pay | Attending: Emergency Medicine | Admitting: Emergency Medicine

## 2017-03-11 DIAGNOSIS — Y33XXXA Other specified events, undetermined intent, initial encounter: Secondary | ICD-10-CM | POA: Insufficient documentation

## 2017-03-11 DIAGNOSIS — F17210 Nicotine dependence, cigarettes, uncomplicated: Secondary | ICD-10-CM | POA: Insufficient documentation

## 2017-03-11 DIAGNOSIS — Z885 Allergy status to narcotic agent status: Secondary | ICD-10-CM | POA: Insufficient documentation

## 2017-03-11 DIAGNOSIS — J45909 Unspecified asthma, uncomplicated: Secondary | ICD-10-CM | POA: Insufficient documentation

## 2017-03-11 DIAGNOSIS — Y939 Activity, unspecified: Secondary | ICD-10-CM | POA: Insufficient documentation

## 2017-03-11 DIAGNOSIS — S73005A Unspecified dislocation of left hip, initial encounter: Secondary | ICD-10-CM | POA: Insufficient documentation

## 2017-03-11 DIAGNOSIS — Y92009 Unspecified place in unspecified non-institutional (private) residence as the place of occurrence of the external cause: Secondary | ICD-10-CM | POA: Insufficient documentation

## 2017-03-11 DIAGNOSIS — Y999 Unspecified external cause status: Secondary | ICD-10-CM | POA: Insufficient documentation

## 2017-03-11 DIAGNOSIS — J449 Chronic obstructive pulmonary disease, unspecified: Secondary | ICD-10-CM | POA: Insufficient documentation

## 2017-03-11 MED ORDER — PROPOFOL 10 MG/ML IV BOLUS
INTRAVENOUS | Status: AC | PRN
  Administered 2017-03-11: 100 mg via INTRAVENOUS

## 2017-03-11 MED ORDER — HYDROCODONE-ACETAMINOPHEN 5-325 MG PO TABS
1.0000 | ORAL_TABLET | Freq: Four times a day (QID) | ORAL | 0 refills | Status: DC | PRN
Start: 2017-03-11 — End: 2020-12-20

## 2017-03-11 MED ORDER — HYDROMORPHONE HCL 1 MG/ML IJ SOLN
1.0000 mg | Freq: Once | INTRAMUSCULAR | Status: AC
  Administered 2017-03-11: 1 mg via INTRAVENOUS
  Filled 2017-03-11: qty 1

## 2017-03-11 MED ORDER — SODIUM CHLORIDE 0.9 % IV SOLN
INTRAVENOUS | Status: AC | PRN
  Administered 2017-03-11: 10 mL/h via INTRAVENOUS

## 2017-03-11 MED ORDER — NAPROXEN 500 MG PO TABS: ORAL_TABLET | ORAL | 0 refills | Status: DC

## 2017-03-11 MED ORDER — FENTANYL CITRATE (PF) 100 MCG/2ML IJ SOLN
50.0000 ug | INTRAMUSCULAR | Status: DC | PRN
  Filled 2017-03-11: qty 2

## 2017-03-11 MED ORDER — PROPOFOL 10 MG/ML IV BOLUS
200.0000 mg | Freq: Once | INTRAVENOUS | Status: AC
  Administered 2017-03-11: 200 mg via INTRAVENOUS
  Filled 2017-03-11: qty 20

## 2017-03-11 NOTE — Discharge Instructions (Addendum)
Wear the knee immobilizer to help keep your hip from popping out of joint again. Call Dr Lorin [facility] office to get a follow up appointment. Take the medications as prescribed. Use ice for comfort.

## 2017-03-11 NOTE — ED Provider Notes (Signed)
[facility] EMERGENCY DEPARTMENT Provider Note   CSN: [id number] Arrival date & time: 03/11/17  0220  Time seen 03:40 AM    History   Chief Complaint Chief Complaint  Patient presents with  . Hip Injury    HPI Linda Chang is a 51 y.o. female.  HPI  Patient states she had bilateral hip replacement done about 16 years ago. She states she has had problems with the left hip starting shortly after her surgery. He has had dislocations at least 8 times. Last time was about 4 years ago. She states about 1 AM this morning she was sitting on her couch and she is placed lives under to make her couch higher. She states she leaned over to hand her son something in her hip popped out. She states she has never been able to have her hip reduced in the emergency department initial eyes has to go to the OR. She states however she is willing to have [place] try once. Her surgery was done in [place]. She does not have a local orthopedist.  PCP none  Past Medical History:  Diagnosis Date  . Anxiety   . Arthritis   . Asthma   . Chronic back pain   . Chronic neck pain   . COPD (chronic obstructive pulmonary disease) ([place])   . Depression    prior hospitalization   . Finger joint swelling   . Morning joint stiffness   . Polyarthralgia   . S/P bilateral hip replacements 2004, 2003  . Tobacco use     There are no active problems to display for this patient.   Past Surgical History:  Procedure Laterality Date  . TONSILLECTOMY    . TOTAL HIP ARTHROPLASTY Bilateral     OB History    No data available       Home Medications    Prior to Admission medications   Medication Sig Start Date End Date Taking? Authorizing Provider  HYDROcodone-acetaminophen (NORCO/VICODIN) 5-325 MG tablet Take 1-2 tablets by mouth every 6 (six) hours as needed for severe pain. Patient not taking: Reported on 03/11/2017 09/19/16   Duffy Bruce, MD  ibuprofen (ADVIL,MOTRIN) 600 MG tablet Take 1  tablet (600 mg total) by mouth every 8 (eight) hours as needed. Patient not taking: Reported on 03/11/2017 09/19/16   Duffy Bruce, MD    Family History No family history on file.  Social History Social History  Substance Use Topics  . Smoking status: Current Every Day Smoker    Packs/day: 0.25    Years: 30.00    Types: Cigarettes  . Smokeless tobacco: Never Used  . Alcohol use No     Comment: hx/o heavier ETOH use as teen  moved to [place] 3 years ago   Allergies   Morphine and related   Review of Systems Review of Systems  All other systems reviewed and are negative.    Physical Exam Updated Vital Signs BP 119/82   Pulse 73   Temp 97.6 F (36.4 C)   Resp 14   Ht 5\' 8"  (1.727 m)   LMP 03/02/2014   SpO2 100%   Physical Exam  Constitutional: She is oriented to person, place, and time. She appears well-developed and well-nourished. She appears distressed.  HENT:  Head: Normocephalic and atraumatic.  Right Ear: External ear normal.  Left Ear: External ear normal.  Nose: Nose normal.  Eyes: Conjunctivae and EOM are normal.  Neck: Normal range of motion.  Cardiovascular: Normal rate.  Pulmonary/Chest: Effort normal. No respiratory distress.  Musculoskeletal:  Patient's left leg is shortened and her left foot is inverted.she has good distal pulses, her foot temperature is normal.  Neurological: She is alert and oriented to person, place, and time. No cranial nerve deficit.  Skin: Skin is warm and dry. No erythema.  Psychiatric: She has a normal mood and affect. Her behavior is normal. Thought content normal.     ED Treatments / Results  Labs (all labs ordered are listed, but only abnormal results are displayed) Labs Reviewed - No data to display  EKG  EKG Interpretation None       Radiology Dg Hip Port Unilat W Or Wo Pelvis 1 View Left  Result Date: 03/11/2017 CLINICAL DATA:  51 y/o  F; status post left hip reduction. EXAM: DG HIP (WITH OR  WITHOUT PELVIS) 1V PORT LEFT COMPARISON:  03/11/2017 right hip radiographs FINDINGS: Single frontal radiograph demonstrates the femoral head component of left hip prosthesis well seated within the acetabular component. No acute fracture identified. IMPRESSION: Interval reduction of left hip prosthesis dislocation. Electronically Signed   By: Kristine Garbe M.D.   On: 03/11/2017 05:48   Dg Hip Unilat With Pelvis 2-3 Views Left  Result Date: 03/11/2017 CLINICAL DATA:  Pain and shortening of the left leg after feeling a pop. History of bilateral hip replacements and tip dislocations. EXAM: DG HIP (WITH OR WITHOUT PELVIS) 2-3V LEFT COMPARISON:  None. FINDINGS: Left total hip arthroplasty. There is superolateral and posterior dislocation of the femoral component with respect to the acetabular component. No acute fractures identified. Right total hip arthroplasty also demonstrated and appears intact. IMPRESSION: Dislocated left total hip arthroplasty. Electronically Signed   By: Lucienne Capers M.D.   On: 03/11/2017 03:18    Procedures .Sedation Date/Time: 03/11/2017 5:42 AM Performed by: Tomi Bamberger, Archana Eckman Authorized by: Rolland Porter   Consent:    Consent obtained:  Verbal and written   Consent given by:  Patient   Risks discussed:  Prolonged hypoxia resulting in organ damage   Alternatives discussed:  Analgesia without sedation Indications:    Procedure performed:  Dislocation reduction   Procedure necessitating sedation performed by:  Different physician   Intended level of sedation:  Moderate (conscious sedation) Pre-sedation assessment:    Time since last food or drink:  3 hours   NPO status caution: urgency dictates proceeding with non-ideal NPO status     ASA classification: class 1 - normal, healthy patient     Neck mobility: normal     Mouth opening:  3 or more finger widths   Mallampati score:  I - soft palate, uvula, fauces, pillars visible   Pre-sedation assessments completed and  reviewed: airway patency   Immediate pre-procedure details:    Reviewed: vital signs and NPO status     Verified: bag valve mask available, emergency equipment available, intubation equipment available, IV patency confirmed and oxygen available   Procedure details (see MAR for exact dosages):    Preoxygenation:  Nonrebreather mask   Sedation:  Propofol   Analgesia:  Hydromorphone   Intra-procedure monitoring:  Blood pressure monitoring, cardiac monitor, continuous capnometry, continuous pulse oximetry, frequent LOC assessments and frequent vital sign checks   Intra-procedure events comment:  Chin lift   Intra-procedure management:  Airway repositioning   Total Provider sedation time (minutes):  20 Post-procedure details:    Attendance: Constant attendance by certified staff until patient recovered     Recovery: Patient returned to pre-procedure baseline  Post-sedation assessments completed and reviewed: airway patency, cardiovascular function, hydration status, mental status, pain level and respiratory function     Post-sedation assessments completed and reviewed: nausea/vomiting not reviewed     Patient is stable for discharge or admission: yes     Patient tolerance:  Tolerated well, no immediate complications Comments:     Patient received a total of 120 mg of propofol with adequate sedation.   (including critical care time)  Medications Ordered in ED Medications  fentaNYL (SUBLIMAZE) injection 50 mcg (not administered)  HYDROmorphone (DILAUDID) injection 1 mg (1 mg Intravenous Given 03/11/17 0420)  propofol (DIPRIVAN) 10 mg/mL bolus/IV push 200 mg (200 mg Intravenous Given 03/11/17 0541)  HYDROmorphone (DILAUDID) injection 1 mg (1 mg Intravenous Given 03/11/17 0524)  0.9 %  sodium chloride infusion ( Intravenous Stopped 03/11/17 0634)  propofol (DIPRIVAN) 10 mg/mL bolus/IV push (100 mg Intravenous Given 03/11/17 0527)     Initial Impression / Assessment and Plan / ED Course    I have reviewed the triage vital signs and the nursing notes.  Pertinent labs & imaging results that were available during my care of the patient were reviewed by me and considered in my medical decision making (see chart for details).     Patient states she would let me try to reduce her hip as long as she was not awake. She was prepared for procedural sedation.  Patient was reduced by PA Sanders.  Post reduction x-ray was done and the hip looks reduced. She was placed in a knee immobilizer. She was referred to the orthopedist on call.   07:30 AM Patient forgot her cell phone at home, no family has come to visit her. She is trying to find a way home.  Final Clinical Impressions(s) / ED Diagnoses   Final diagnoses:  Dislocation of left hip, initial encounter [facility])    New Prescriptions New Prescriptions   No medications on file     Rolland Porter, MD 03/11/17 [phone]

## 2017-03-11 NOTE — ED Provider Notes (Signed)
  Physical Exam  BP 98/66   Pulse 73   Temp 97.6 F (36.4 C)   Resp 18   Ht 5\' 8"  (1.727 m)   LMP 03/02/2014   SpO2 99%   Physical Exam  ED Course  Reduction of dislocation Date/Time: 03/11/2017 5:39 AM Performed by: Larene Pickett Authorized by: Larene Pickett  Consent: Verbal consent obtained. Risks and benefits: risks, benefits and alternatives were discussed Consent given by: patient Patient understanding: patient states understanding of the procedure being performed Patient consent: the patient's understanding of the procedure matches consent given Procedure consent: procedure consent matches procedure scheduled Relevant documents: relevant documents present and verified Site marked: the operative site was marked Imaging studies: imaging studies available Required items: required blood products, implants, devices, and special equipment available Patient identity confirmed: verbally with patient and arm band Time out: Immediately prior to procedure a "time out" was called to verify the correct patient, procedure, equipment, support staff and site/side marked as required. Local anesthesia used: no  Anesthesia: Local anesthesia used: no  Sedation: Patient sedated: yes Sedation type: moderate (conscious) sedation Sedatives: propofol Analgesia: hydromorphone Sedation start date/time: 03/11/2017 5:25 AM Sedation end date/time: 03/11/2017 5:35 AM Vitals: Vital signs were monitored during sedation. Patient tolerance: Patient tolerated the procedure well with no immediate complications Comments: Left hip reduced using captain morgan technique.  Left leg no longer shortened and internally rotated.  Patient tolerated well.          Larene Pickett, PA-C 03/11/17 [id number]    Rolland Porter, MD 03/11/17 [phone]

## 2017-03-11 NOTE — ED Notes (Signed)
Consent signed.  ETCO2 monitor set up, pt placed on 12 lead, VS set to cycle Q3 minutes once procedure initiated.  Ambu bag, suction and crash cart to bedside in preparation for conscious sedation.

## 2017-03-11 NOTE — ED Notes (Signed)
Patient transported to X-ray 

## 2017-03-11 NOTE — Progress Notes (Signed)
Orthopedic Tech Progress Note Patient Details:  Linda Chang [birth year] [id number]  Ortho Devices Type of Ortho Device: Knee Immobilizer Ortho Device/Splint Location: lle Ortho Device/Splint Interventions: Ordered, Application, Adjustment   Karolee Stamps 03/11/2017, 5:59 AM

## 2017-03-11 NOTE — ED Triage Notes (Signed)
Pt BIB [facility] for hip pain/injury. Pt has a history of bilateral hip replacements and history of hip dislocation also. Pt was handing something to her son and felt it pop. Leg is shortened and slightly rotated

## 2017-06-24 ENCOUNTER — Emergency Department (HOSPITAL_COMMUNITY)
Admission: EM | Admit: 2017-06-24 | Discharge: 2017-06-24 | Disposition: A | Discharge status: Home / Self Care | Payer: Self-pay | Attending: Emergency Medicine | Admitting: Emergency Medicine

## 2017-06-24 ENCOUNTER — Emergency Department (HOSPITAL_COMMUNITY): Payer: Self-pay

## 2017-06-24 ENCOUNTER — Encounter (HOSPITAL_COMMUNITY): Payer: Self-pay | Admitting: Emergency Medicine

## 2017-06-24 DIAGNOSIS — IMO0001 Reserved for inherently not codable concepts without codable children: Secondary | ICD-10-CM

## 2017-06-24 DIAGNOSIS — S73005A Unspecified dislocation of left hip, initial encounter: Secondary | ICD-10-CM

## 2017-06-24 DIAGNOSIS — J449 Chronic obstructive pulmonary disease, unspecified: Secondary | ICD-10-CM | POA: Insufficient documentation

## 2017-06-24 DIAGNOSIS — T84021A Dislocation of internal left hip prosthesis, initial encounter: Secondary | ICD-10-CM | POA: Insufficient documentation

## 2017-06-24 DIAGNOSIS — Y792 Prosthetic and other implants, materials and accessory orthopedic devices associated with adverse incidents: Secondary | ICD-10-CM | POA: Insufficient documentation

## 2017-06-24 DIAGNOSIS — F17210 Nicotine dependence, cigarettes, uncomplicated: Secondary | ICD-10-CM | POA: Insufficient documentation

## 2017-06-24 LAB — BASIC METABOLIC PANEL
ANION GAP: 9 (ref 5–15)
BUN: 23 mg/dL — ABNORMAL HIGH (ref 6–20)
CALCIUM: 7.9 mg/dL — AB (ref 8.9–10.3)
CHLORIDE: 111 mmol/L (ref 101–111)
CO2: 26 mmol/L (ref 22–32)
Creatinine, Ser: 0.98 mg/dL (ref 0.44–1.00)
GFR calc Af Amer: >60 mL/min (ref >60)
GFR calc non Af Amer: >60 mL/min (ref >60)
GLUCOSE: 88 mg/dL (ref 65–99)
POTASSIUM: 4.4 mmol/L (ref 3.5–5.1)
Sodium: 146 mmol/L — ABNORMAL HIGH (ref 135–145)

## 2017-06-24 LAB — CBC WITH DIFFERENTIAL/PLATELET
Basophils Absolute: 0 10*3/uL (ref 0.0–0.1)
Basophils Relative: 0 %
Eosinophils Absolute: 0.1 10*3/uL (ref 0.0–0.7)
Eosinophils Relative: 1 %
HEMATOCRIT: 42.8 % (ref 36.0–46.0)
HEMOGLOBIN: 14.4 g/dL (ref 12.0–15.0)
LYMPHS ABS: 3 10*3/uL (ref 0.7–4.0)
LYMPHS PCT: 31 %
MCH: 30.1 pg (ref 26.0–34.0)
MCHC: 33.6 g/dL (ref 30.0–36.0)
MCV: 89.4 fL (ref 78.0–100.0)
MONO ABS: 0.6 10*3/uL (ref 0.1–1.0)
Monocytes Relative: 6 %
NEUTROS ABS: 6 10*3/uL (ref 1.7–7.7)
NEUTROS PCT: 62 %
Platelets: 252 10*3/uL (ref 150–400)
RBC: 4.79 MIL/uL (ref 3.87–5.11)
RDW: 14.3 % (ref 11.5–15.5)
WBC: 9.7 10*3/uL (ref 4.0–10.5)

## 2017-06-24 MED ORDER — PROPOFOL 10 MG/ML IV BOLUS
INTRAVENOUS | Status: AC | PRN
  Administered 2017-06-24: 35 mg via INTRAVENOUS
  Administered 2017-06-24: 15 mg via INTRAVENOUS

## 2017-06-24 MED ORDER — FENTANYL CITRATE (PF) 100 MCG/2ML IJ SOLN
50.0000 ug | Freq: Once | INTRAMUSCULAR | Status: AC
  Administered 2017-06-24: 50 ug via INTRAVENOUS
  Filled 2017-06-24: qty 2

## 2017-06-24 MED ORDER — KETAMINE HCL 10 MG/ML IJ SOLN
0.5000 mg/kg | Freq: Once | INTRAMUSCULAR | Status: AC
  Administered 2017-06-24: 50 mg via INTRAVENOUS
  Filled 2017-06-24: qty 1

## 2017-06-24 MED ORDER — PROPOFOL 10 MG/ML IV BOLUS
0.5000 mg/kg | Freq: Once | INTRAVENOUS | Status: AC
  Administered 2017-06-24: 50 mg via INTRAVENOUS
  Filled 2017-06-24: qty 20

## 2017-06-24 MED ORDER — SODIUM CHLORIDE 0.9 % IV SOLN
INTRAVENOUS | Status: AC | PRN
  Administered 2017-06-24: 1000 mL via INTRAVENOUS

## 2017-06-24 MED ORDER — HYDROMORPHONE HCL 1 MG/ML IJ SOLN
0.5000 mg | Freq: Once | INTRAMUSCULAR | Status: AC
  Administered 2017-06-24: 0.5 mg via INTRAVENOUS
  Filled 2017-06-24: qty 1

## 2017-06-24 MED ORDER — KETAMINE HCL 10 MG/ML IJ SOLN
INTRAMUSCULAR | Status: AC | PRN
  Administered 2017-06-24: 35 mg via INTRAVENOUS
  Administered 2017-06-24: 15 mg via INTRAVENOUS

## 2017-06-24 MED ORDER — SODIUM CHLORIDE 0.9 % IV BOLUS (SEPSIS)
1000.0000 mL | Freq: Once | INTRAVENOUS | Status: AC
Start: 2017-06-24 — End: 2017-06-24
  Administered 2017-06-24: 1000 mL via INTRAVENOUS

## 2017-06-24 NOTE — ED Notes (Signed)
Bed: SW54 Expected date:  Expected time:  Means of arrival:  Comments: 52 yo hip deformity

## 2017-06-24 NOTE — ED Notes (Signed)
Spoke with patient's son who reports he will call [employer] for patient upon discharge.

## 2017-06-24 NOTE — ED Notes (Signed)
Respiratory made aware of sedation.

## 2017-06-24 NOTE — ED Notes (Signed)
Patient transported to X-ray 

## 2017-06-24 NOTE — ED Triage Notes (Signed)
Per EMS, patient coming from home, c/o left hip dislocation after sudden movement on bed this morning. Sensation and pulse present in left foot.   20g LAC 148mcg Fentanyl with EMS

## 2017-06-24 NOTE — ED Notes (Signed)
IV removed from left forearm.

## 2017-06-24 NOTE — ED Provider Notes (Signed)
Face-to-face evaluation   History: Patient was being helped from bed when her left hip popped out of joint.  She has had this problem recurrently, last couple months ago.  She did not follow-up with orthopedics following that because "I do not have insurance."  She denies other injury today.  Physical exam: Alert, obese, cooperative.  Heart regular rate and rhythm without murmur lungs clear anteriorly.  Chest nontender to palpation.  Left leg is shortened, at the hip, with intact distal sensation and circulation.  She resists motion of the left hip secondary to pain.  .Sedation Date/Time: 06/24/2017 6:06 PM Performed by: Daleen Bo, MD Authorized by: Daleen Bo, MD   Consent:    Consent obtained:  Verbal   Consent given by:  Patient   Risks discussed:  Allergic reaction, inadequate sedation, nausea, prolonged hypoxia resulting in organ damage, respiratory compromise necessitating ventilatory assistance and intubation and vomiting   Alternatives discussed:  Analgesia without sedation and anxiolysis Universal protocol:    Procedure explained and questions answered to patient or proxy's satisfaction: yes     Relevant documents present and verified: yes     Test results available and properly labeled: yes     Imaging studies available: yes     Required blood products, implants, devices, and special equipment available: no     Site/side marked: no     Immediately prior to procedure a time out was called: yes     Patient identity confirmation method:  Verbally with patient and arm band Indications:    Procedure performed:  Dislocation reduction   Procedure necessitating sedation performed by:  Different physician   Intended level of sedation:  Deep Pre-sedation assessment:    Time since last food or drink:  2.5 hours   ASA classification: class 2 - patient with mild systemic disease     Neck mobility: normal     Mouth opening:  3 or more finger widths   Thyromental distance:  3  finger widths   Mallampati score:  III - soft palate, base of uvula visible   Pre-sedation assessments completed and reviewed: airway patency, cardiovascular function, hydration status, mental status, nausea/vomiting, pain level, respiratory function and temperature     Pre-sedation assessment completed:  06/24/2017 5:40 PM Immediate pre-procedure details:    Reassessment: Patient reassessed immediately prior to procedure     Reviewed: vital signs, relevant labs/tests and NPO status     Verified: bag valve mask available, emergency equipment available, intubation equipment available, IV patency confirmed, oxygen available and suction available   Procedure details (see MAR for exact dosages):    Preoxygenation:  Nasal cannula   Sedation:  Propofol and etomidate   Intra-procedure monitoring:  Blood pressure monitoring, cardiac monitor, continuous pulse oximetry, frequent LOC assessments, frequent vital sign checks and continuous capnometry   Intra-procedure events: airway compromise     Intra-procedure management:  Airway repositioning   Total Provider sedation time (minutes):  20 Post-procedure details:    Post-sedation assessment completed:  06/24/2017 6:09 PM   Attendance: Constant attendance by certified staff until patient recovered     Recovery: Patient returned to pre-procedure baseline     Post-sedation assessments completed and reviewed: airway patency, cardiovascular function, hydration status, mental status, nausea/vomiting, pain level, respiratory function and temperature     Patient is stable for discharge or admission: yes     Patient tolerance:  Tolerated well, no immediate complications Comments:     Left hip dislocation successfully reduced, using procedural sedation,  by Evette Cristal, PA, under my direct supervision and guidance      Medical screening examination/treatment/procedure(s) were conducted as a shared visit with non-physician practitioner(s) and myself.  I personally  evaluated the patient during the encounter    Daleen Bo, MD 06/24/17 2300

## 2017-06-24 NOTE — ED Notes (Signed)
Ortho tech made aware of sedation.

## 2017-06-24 NOTE — ED Notes (Signed)
ED Provider at bedside. 

## 2017-06-24 NOTE — Discharge Instructions (Signed)
You can take Tylenol or Ibuprofen as directed for pain. You can alternate Tylenol and Ibuprofen every 4 hours. If you take Tylenol at 1pm, then you can take Ibuprofen at 5pm. Then you can take Tylenol again at 9pm.   As we discussed, you need to follow-up with referred orthopedic doctor.  Call their office and arrange for an appointment.  Return to the emergency department for any worsening pain, difficulty walking, numbness/weakness of the leg, any other worsening or concerning symptoms.

## 2017-06-24 NOTE — ED Provider Notes (Signed)
[facility] DEPT Provider Note   CSN: [id number] Arrival date & time: 06/24/17  1607     History   Chief Complaint Chief Complaint  Patient presents with  . Dislocation    HPI Linda Chang is a 52 y.o. female past medical history of bilateral hip replacements who presents for evaluation of acute left hip pain that began just prior to arrival.  Patient states that she was laying on a mattress when her son attempted to lift up the mattress, causing her to have sudden movement.  Patient reports that she had immediate left hip pain, prompting EMS call.  Patient has not been able to ambulate or bear weight since the incident.  On EMS arrival, left lower extremity was shortened and internally rotated.  Patient received 100 mg of fentanyl in route with minimal improvement in pain.  Patient reports that she has a history of hip dose dislocations and that current symptoms feel similar to previous dislocations.  Patient denies any chest pain, difficulty breathing, numbness/weakness.  The history is provided by the patient.    Past Medical History:  Diagnosis Date  . Anxiety   . Arthritis   . Asthma   . Chronic back pain   . Chronic neck pain   . COPD (chronic obstructive pulmonary disease) ([place])   . Depression    prior hospitalization   . Finger joint swelling   . Morning joint stiffness   . Polyarthralgia   . S/P bilateral hip replacements 2004, 2003  . Tobacco use     There are no active problems to display for this patient.   Past Surgical History:  Procedure Laterality Date  . TONSILLECTOMY    . TOTAL HIP ARTHROPLASTY Bilateral     OB History    No data available       Home Medications    Prior to Admission medications   Medication Sig Start Date End Date Taking? Authorizing Provider  albuterol (PROVENTIL HFA;VENTOLIN HFA) 108 (90 Base) MCG/ACT inhaler Inhale 1-2 puffs into the lungs every 6 (six) hours as needed for wheezing or  shortness of breath.   Yes [provider]  HYDROcodone-acetaminophen (NORCO/VICODIN) 5-325 MG tablet Take 1 tablet by mouth every 6 (six) hours as needed for severe pain. Patient not taking: Reported on 06/24/2017 03/11/17   Rolland Porter, MD  naproxen (NAPROSYN) 500 MG tablet Take 1 po BID with food prn pain Patient not taking: Reported on 06/24/2017 03/11/17   Rolland Porter, MD    Family History No family history on file.  Social History Social History   Tobacco Use  . Smoking status: Current Every Day Smoker    Packs/day: 0.25    Years: 30.00    Pack years: 7.50    Types: Cigarettes  . Smokeless tobacco: Never Used  Substance Use Topics  . Alcohol use: No    Comment: hx/o heavier ETOH use as teen  . Drug use: No     Allergies   Morphine and related   Review of Systems Review of Systems  Constitutional: Negative for fever.  Respiratory: Negative for shortness of breath.   Cardiovascular: Negative for chest pain.  Gastrointestinal: Negative for abdominal pain, diarrhea, nausea and vomiting.  Musculoskeletal: Negative for back pain and neck pain.       Left Hip pain  Skin: Negative for rash.  Neurological: Negative for weakness and numbness.  All other systems reviewed and are negative.    Physical Exam Updated Vital Signs  BP 112/68   Pulse 86   Temp (!) 97.5 F (36.4 C) (Oral)   Resp (!) 23   Ht 5\' 8"  (1.727 m)   Wt 86.2 kg (190 lb)   LMP 03/02/2014   SpO2 91%   BMI 28.89 kg/m   Physical Exam  Constitutional: She is oriented to person, place, and time. She appears well-developed and well-nourished.  Appears uncomfortable but no acute distress   HENT:  Head: Normocephalic and atraumatic.  Mouth/Throat: Oropharynx is clear and moist and mucous membranes are normal.  Eyes: Conjunctivae, EOM and lids are normal. Pupils are equal, round, and reactive to light.  Neck: Full passive range of motion without pain.  Cardiovascular: Normal rate, regular rhythm,  normal heart sounds and normal pulses. Exam reveals no gallop and no friction rub.  No murmur heard. Pulses:      Dorsalis pedis pulses are 2+ on the right side, and 2+ on the left side.  Pulmonary/Chest: Effort normal and breath sounds normal.  Abdominal: Soft. Normal appearance. There is no tenderness. There is no rigidity and no guarding.  Musculoskeletal: Normal range of motion.  Left lower extremity appears shortened and internally rotated.  Unable to assess range of motion secondary to patient's pain.  Tenderness palpation overlying the left hip.  Full range of motion of the right lower extremity.  No tenderness palpation the right lower extremity.  Neurological: She is alert and oriented to person, place, and time.  Sensation intact along major nerve distributions of BLEyopu  Skin: Skin is warm and dry. Capillary refill takes less than 2 seconds.  Good distal cap refill.  LLE is not dusky in appearance or cool to touch.  Psychiatric: She has a normal mood and affect. Her speech is normal.  Nursing note and vitals reviewed.    ED Treatments / Results  Labs (all labs ordered are listed, but only abnormal results are displayed) Labs Reviewed  BASIC METABOLIC PANEL - Abnormal; Notable for the following components:      Result Value   Sodium 146 (*)    BUN 23 (*)    Calcium 7.9 (*)    All other components within normal limits  CBC WITH DIFFERENTIAL/PLATELET    EKG  EKG Interpretation  Date/Time:  Sunday June 24 2017 17:13:14 EST Ventricular Rate:  76 PR Interval:    QRS Duration: 94 QT Interval:  382 QTC Calculation: 430 R Axis:   99 Text Interpretation:  Sinus rhythm Lateral infarct, old No old tracing to compare Confirmed by Daleen Bo [phone]) on 06/24/2017 5:27:24 PM       Radiology Dg Pelvis Portable  Result Date: 06/24/2017 CLINICAL DATA:  Spot status post reduction of left hip dislocation. EXAM: PORTABLE PELVIS 1-2 VIEWS COMPARISON:  June 24, 2017 at  4:40 p.m. FINDINGS: The previously identified left hip dislocation has been reduced. Hardware is in good position on this single frontal view. IMPRESSION: Reduction of left hip dislocation.  No fractures identified. Electronically Signed   By: Dorise Bullion III M.D   On: 06/24/2017 18:25   Dg Hip Unilat W Or Wo Pelvis 2-3 Views Left  Result Date: 06/24/2017 CLINICAL DATA:  Left hip pain after sudden movement this morning. Multiple dislocations in the past. EXAM: DG HIP (WITH OR WITHOUT PELVIS) 2-3V LEFT COMPARISON:  None. FINDINGS: There is cephalad and posterior dislocation of the prosthetic left femoral head relative to the prosthetic acetabular component of an uncemented press-fit left hip arthroplasty. The included right uncemented hip  arthroplasty is intact. The bony pelvis is maintained without fracture. Numerous phleboliths are seen within the pelvis bilaterally. IMPRESSION: Dislocated left hip prosthesis with cephalad and posterior dislocation of the femoral head relative to the acetabular cup. No fracture identified. Electronically Signed   By: Ashley Royalty M.D.   On: 06/24/2017 17:05    Procedures Reduction of dislocation Date/Time: 06/24/2017 6:04 PM Performed by: Volanda Napoleon, PA-C Authorized by: Volanda Napoleon, PA-C  Consent: Verbal consent obtained. Consent given by: patient Patient understanding: patient states understanding of the procedure being performed Patient consent: the patient's understanding of the procedure matches consent given Procedure consent: procedure consent matches procedure scheduled Relevant documents: relevant documents present and verified Test results: test results available and properly labeled Site marked: the operative site was marked Imaging studies: imaging studies available Required items: required blood products, implants, devices, and special equipment available Patient identity confirmed: verbally with patient Time out: Immediately prior to  procedure a "time out" was called to verify the correct patient, procedure, equipment, support staff and site/side marked as required. Preparation: Patient was prepped and draped in the usual sterile fashion.  Sedation: Patient sedated: yes Sedation type: moderate (conscious) sedation Sedatives: ketamine and propofol Vitals: Vital signs were monitored during sedation.  Patient tolerance: Patient tolerated the procedure well with no immediate complications Comments: LLE reduced with Carma Leaven technique.     (including critical care time)  Medications Ordered in ED Medications  fentaNYL (SUBLIMAZE) injection 50 mcg (50 mcg Intravenous Given 06/24/17 1632)  sodium chloride 0.9 % bolus 1,000 mL (0 mLs Intravenous Stopped 06/24/17 1933)  HYDROmorphone (DILAUDID) injection 0.5 mg (0.5 mg Intravenous Given 06/24/17 1716)  ketamine (KETALAR) injection 43 mg (50 mg Intravenous Given 06/24/17 1807)  propofol (DIPRIVAN) 10 mg/mL bolus/IV push 43.1 mg (50 mg Intravenous Given 06/24/17 1807)  propofol (DIPRIVAN) 10 mg/mL bolus/IV push (15 mg Intravenous Given 06/24/17 1745)  ketamine (KETALAR) injection (15 mg Intravenous Given 06/24/17 1745)  0.9 %  sodium chloride infusion ( Intravenous Stopped 06/24/17 1934)     Initial Impression / Assessment and Plan / ED Course  I have reviewed the triage vital signs and the nursing notes.  Pertinent labs & imaging results that were available during my care of the patient were reviewed by me and considered in my medical decision making (see chart for details).     52 year old female past medical history of bilateral hip replacements who presents for evaluation of acute left hip pain that began prior to arrival.  Patient has a history of hip dislocations and states that current symptoms feel similar.  Patient given 100 mics of fentanyl in route with minimal improvement in symptoms. Patient is afebrile, non-toxic appearing, sitting comfortably on examination table.  Vital signs reviewed and stable. Patient is neurovascularly intact.  On exam patient's left lower extremity is shortened.  Unable to assess range of motion secondary to patient's pain.  Consider hip dislocation.  Plan for x-ray evaluation.  X-ray reviewed.  There is a positive left hip dislocation.  Discussed with Dr. Eulis Foster. Plan for reduction in the department.   Reduction as documented above.  She tolerated procedure well.  Postreduction films show successful reduction.  Discussed with patient.  She reports improvement in pain.  Patient placed in the immobilizer for support and stabilization.  We will plan to provide outpatient orthopedic referral for patient to follow-up with. Patient had ample opportunity for questions and discussion. All patient's questions were answered with full understanding. Strict return  precautions discussed. Patient expresses understanding and agreement to plan.   Final Clinical Impressions(s) / ED Diagnoses   Final diagnoses:  Dislocation of left hip, initial encounter [facility])    ED Discharge Orders    None       Desma Mcgregor 06/24/17 2033    Daleen Bo, MD 06/24/17 2259    Daleen Bo, MD 06/24/17 2300

## 2017-06-25 ENCOUNTER — Telehealth: Payer: Self-pay | Admitting: Emergency Medicine

## 2017-06-25 NOTE — Telephone Encounter (Signed)
CM received a phone call from pt regarding any prescription pain medication she would have been D/C with from the ED yesterday.  CM advised her that it would have been a blue paper attached to her D/C paperwork.  Pt advised that there wasn't one.  CM advised her to call her PCP triage nurse tonight for further advisement and was advised by pt that she did not have one and currently did not have any ins.  On chart review CM discussed the EDP's directions on pain management which pt stated she had been following.  CM advised her to call and schedule an appointment with the orthopedic surgeon and to call about pain control first thing in the morning.  Advised her that without ins the cost would likely be around $250.00.  CM also provided pt with information for the [facility], pharmacy, and financial counseling.  Pt acknowledged understanding.  CM will notify [facility] CM for follow up need.  No further CM needs noted at this time.

## 2017-06-26 ENCOUNTER — Telehealth: Payer: Self-pay

## 2017-06-26 ENCOUNTER — Telehealth (INDEPENDENT_AMBULATORY_CARE_PROVIDER_SITE_OTHER): Payer: Self-pay | Admitting: Radiology

## 2017-06-26 NOTE — Telephone Encounter (Signed)
Message received from Haze Justin, RN CM requesting a hospital follow up appointment for the patient at [facility]. Informed her that there are not any hospital follow up appointments available at this time.

## 2017-06-26 NOTE — Telephone Encounter (Signed)
Patient called in requesting something for pain.  Seen in [facility] ED 06/24/17 had right hip dislocation reduction and was not sent home with any pain medication. Patient states she has been taking 2 tylenol every 4 hours with no relief, states has not left bed since getting home from hospital.  Patient was offered work in today at 61 but does not have ride or insurance.  Requesting if something can be called into pharmacy for pain.  Phone: [phone]

## 2017-06-26 NOTE — Telephone Encounter (Signed)
I called discussed. Hip done elsewhere. Popped out 20 plus times. Other THA had post op MRSA. nomally takes off KI after 2 or 3 days and then sometimes pops out again . Does not follow hip precautions. I advised leave KI on for 6 wks and she can go back to her surgeon in [place] or one of his partners. FYI

## 2017-06-26 NOTE — Telephone Encounter (Signed)
Please advise 

## 2017-06-26 NOTE — Telephone Encounter (Signed)
noted 

## 2017-06-27 ENCOUNTER — Emergency Department (HOSPITAL_COMMUNITY)
Admission: EM | Admit: 2017-06-27 | Discharge: 2017-06-27 | Discharge status: Against Medical Advice | Payer: Self-pay | Attending: Emergency Medicine | Admitting: Emergency Medicine

## 2017-06-27 ENCOUNTER — Emergency Department (HOSPITAL_COMMUNITY): Payer: Self-pay

## 2017-06-27 ENCOUNTER — Encounter (HOSPITAL_COMMUNITY): Payer: Self-pay | Admitting: Emergency Medicine

## 2017-06-27 DIAGNOSIS — Z5321 Procedure and treatment not carried out due to patient leaving prior to being seen by health care provider: Secondary | ICD-10-CM | POA: Insufficient documentation

## 2017-06-27 NOTE — ED Triage Notes (Addendum)
Patient here from home with complaints of left hip dislocation, state "I know it is out of place". Hx of same. Reports that she is unable to see ortho due to lack of insurance. Pain 10/10. Ambulatory. Reports bilateral hip replacements.

## 2017-06-27 NOTE — ED Notes (Signed)
Stickers returned, pt told registration she was leaving

## 2017-07-10 ENCOUNTER — Telehealth: Payer: Self-pay

## 2017-07-10 NOTE — Telephone Encounter (Signed)
Attempted to contact the patient to discuss scheduling a hospital follow up appointment at [facility] as an appointment is available on 07/12/17. Call placed to # [phone] (M) and a HIPAA Compliant voicemail message was left requesting a call back to # [phone].

## 2017-07-18 ENCOUNTER — Telehealth: Payer: Self-pay

## 2017-07-18 NOTE — Telephone Encounter (Signed)
Attempted to contact the patient to discuss scheduling a follow up appointment as an appointment is available at [facility]. Call placed to # [phone] (H) and a HIPAA compliant voicemail message was left requesting a call back to # [phone]. Call also placed to #  [phone] (M) and the voice mailbox was full, unable to leave a message.

## 2017-07-23 ENCOUNTER — Telehealth: Payer: Self-pay

## 2017-07-23 NOTE — Telephone Encounter (Signed)
Patient has not returned calls to schedule a hospital follow up appointment.  Letter sent to patient requesting that she call the [facility] to schedule an appointment.

## 2019-02-02 IMAGING — DX DG HIP (WITH OR WITHOUT PELVIS) 2-3V*L*
3 series · 3 of 3 positions shown · non-contrast
Comparison: None.

CLINICAL DATA: Pain and shortening of the left leg after feeling a
pop. History of bilateral hip replacements and tip dislocations.

EXAM:
DG HIP (WITH OR WITHOUT PELVIS) 2-3V LEFT

[pelvis ap]
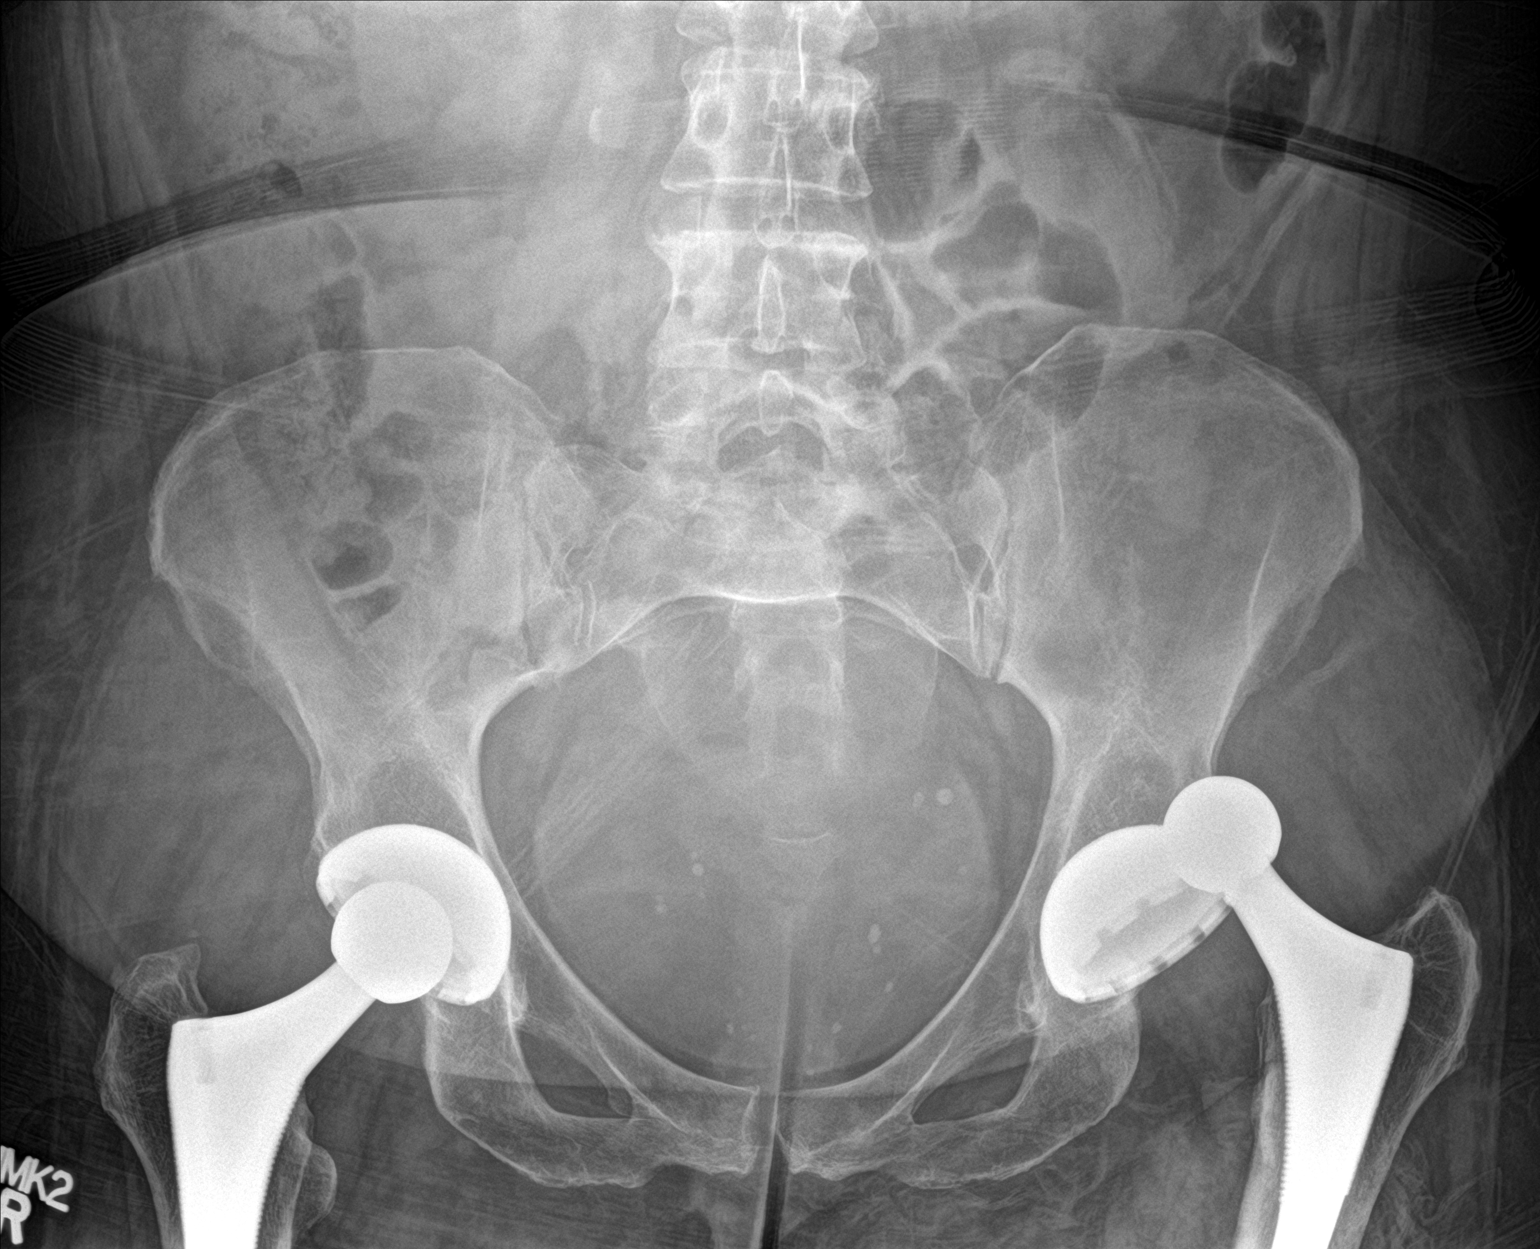

[hip ap]
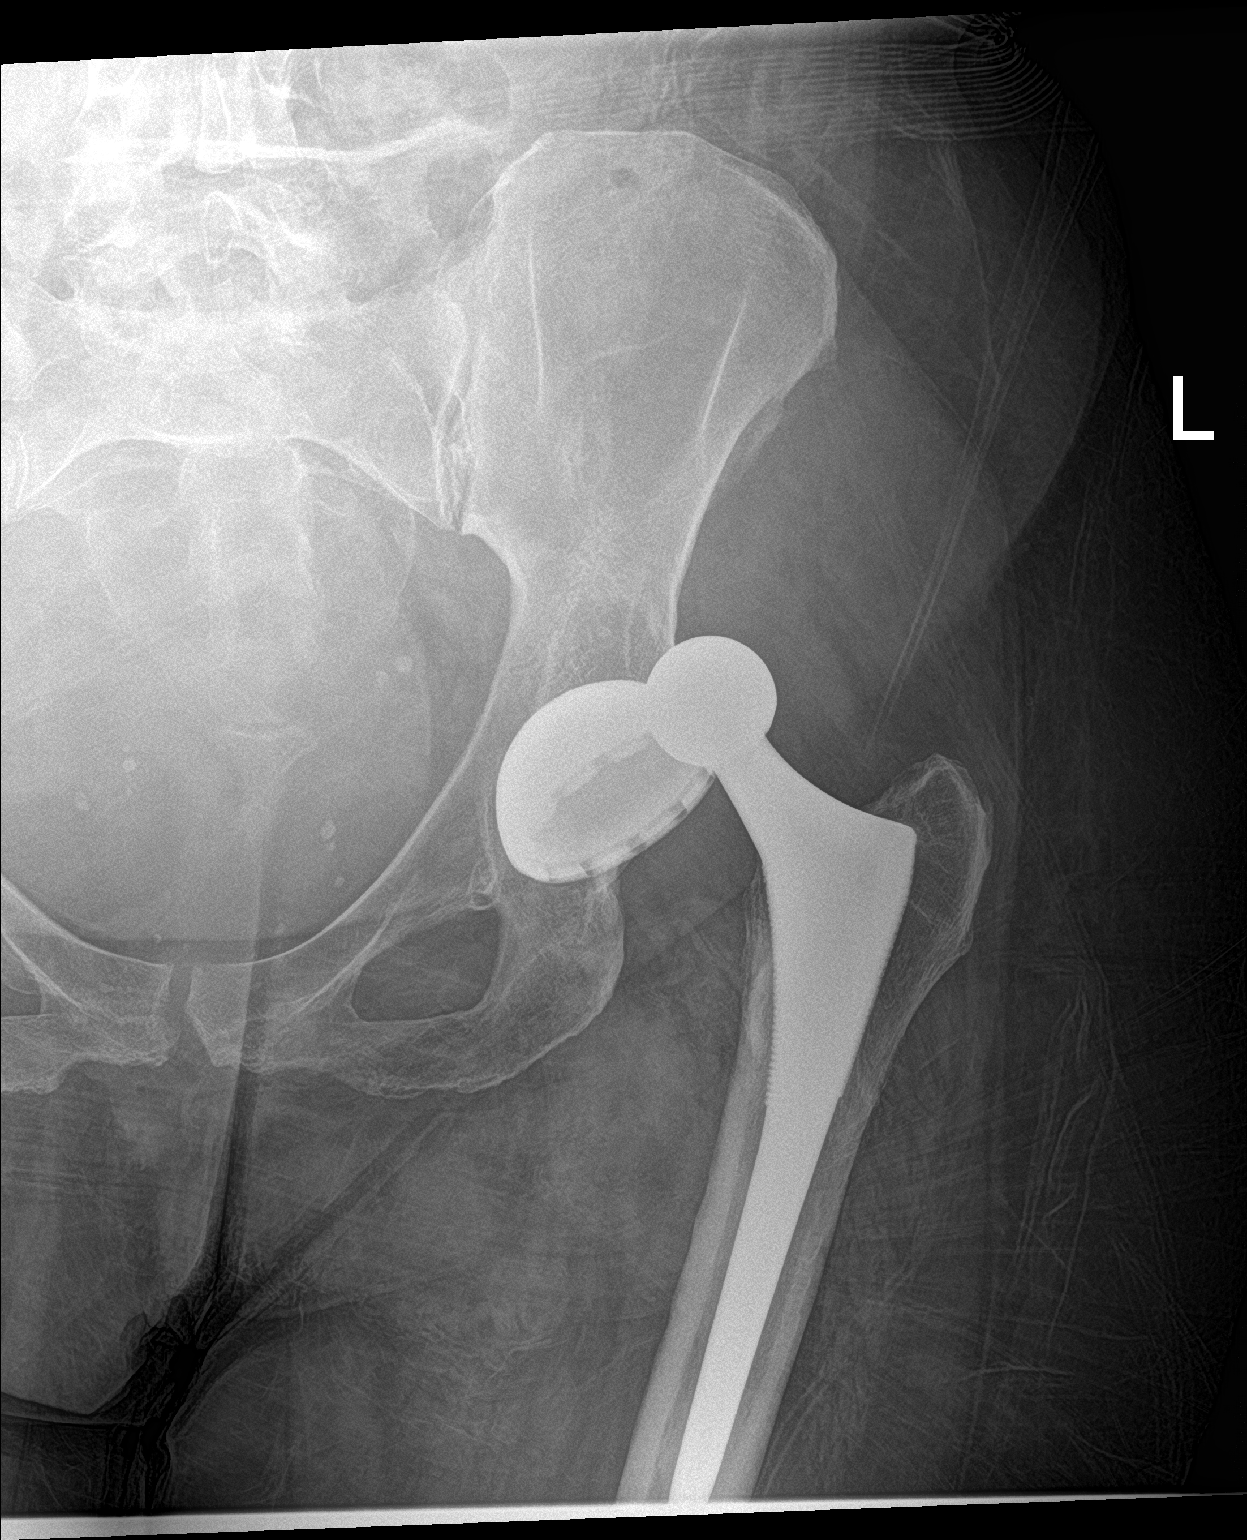

[hip x-table]
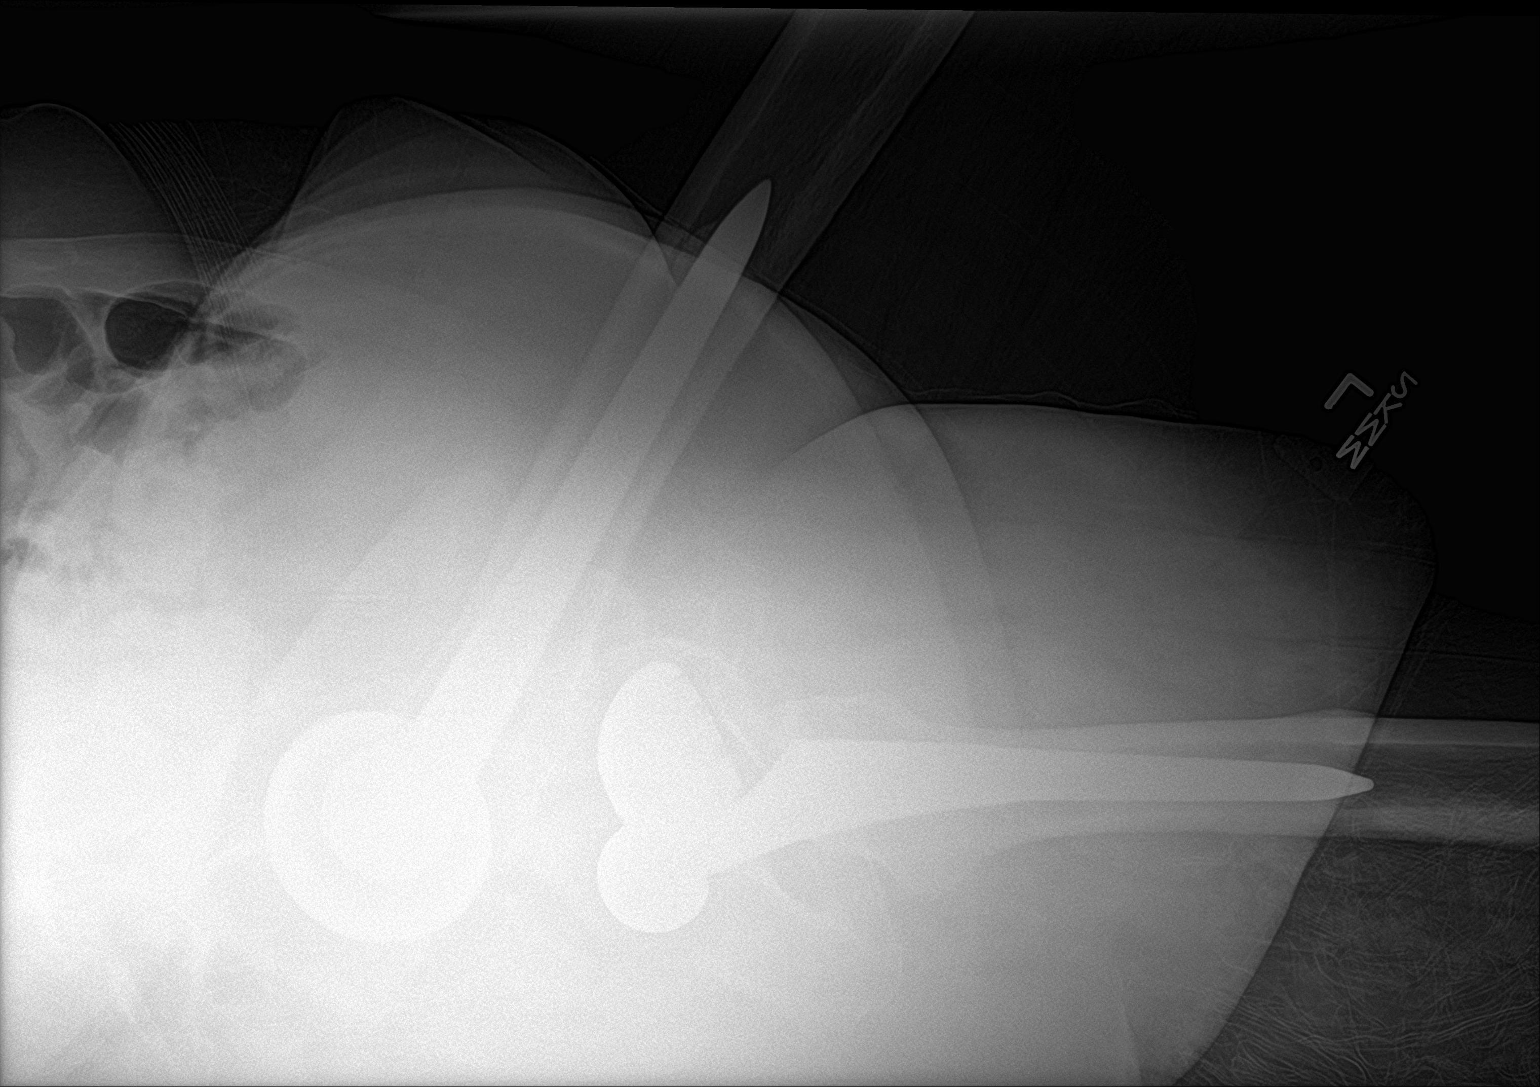

[3 of 3 positions shown; findings below may reference images not displayed]

FINDINGS: Left total hip arthroplasty. There is superolateral and posterior
dislocation of the femoral component with respect to the acetabular
component. No acute fractures identified. Right total hip
arthroplasty also demonstrated and appears intact.
IMPRESSION: Dislocated left total hip arthroplasty.

## 2019-02-02 IMAGING — DX DG HIP (WITH OR WITHOUT PELVIS) 1V PORT*L*
1 series · 1 of 1 positions shown · non-contrast
Comparison: 03/11/2017 right hip radiographs

CLINICAL DATA: 51 y/o  F; status post left hip reduction.

EXAM:
DG HIP (WITH OR WITHOUT PELVIS) 1V PORT LEFT

[hip ap]
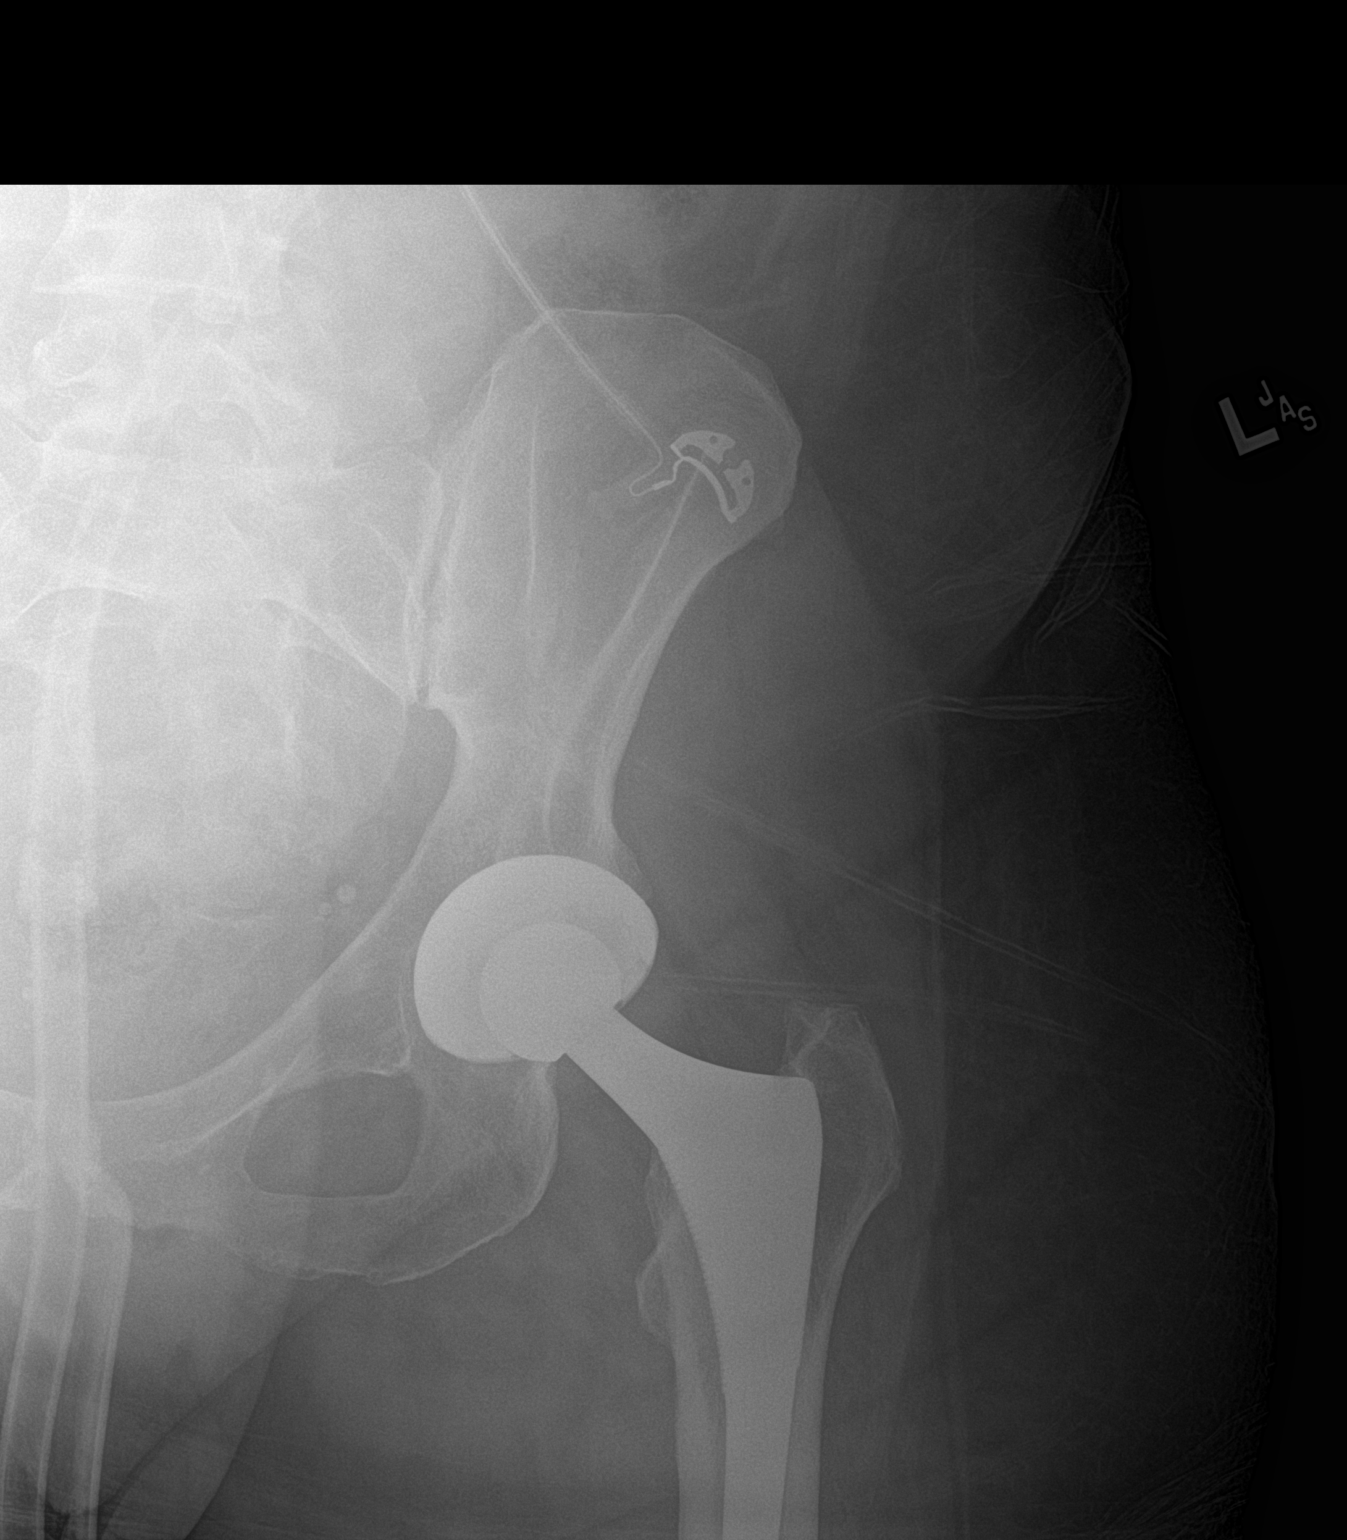

[1 of 1 positions shown; findings below may reference images not displayed]

FINDINGS: Single frontal radiograph demonstrates the femoral head component of
left hip prosthesis well seated within the acetabular component. No
acute fracture identified.
IMPRESSION: Interval reduction of left hip prosthesis dislocation.

By: Ksystof Denstad M.D.

## 2019-06-09 ENCOUNTER — Encounter (HOSPITAL_COMMUNITY): Payer: Self-pay | Admitting: Emergency Medicine

## 2019-06-09 ENCOUNTER — Emergency Department (HOSPITAL_COMMUNITY)
Admission: EM | Admit: 2019-06-09 | Discharge: 2019-06-09 | Disposition: A | Discharge status: Home / Self Care | Payer: Self-pay | Attending: Emergency Medicine | Admitting: Emergency Medicine

## 2019-06-09 ENCOUNTER — Emergency Department (HOSPITAL_COMMUNITY): Payer: Self-pay

## 2019-06-09 DIAGNOSIS — Z79899 Other long term (current) drug therapy: Secondary | ICD-10-CM | POA: Insufficient documentation

## 2019-06-09 DIAGNOSIS — J449 Chronic obstructive pulmonary disease, unspecified: Secondary | ICD-10-CM | POA: Insufficient documentation

## 2019-06-09 DIAGNOSIS — F17210 Nicotine dependence, cigarettes, uncomplicated: Secondary | ICD-10-CM | POA: Insufficient documentation

## 2019-06-09 DIAGNOSIS — T84021A Dislocation of internal left hip prosthesis, initial encounter: Secondary | ICD-10-CM | POA: Insufficient documentation

## 2019-06-09 DIAGNOSIS — T84029A Dislocation of unspecified internal joint prosthesis, initial encounter: Secondary | ICD-10-CM

## 2019-06-09 DIAGNOSIS — Y999 Unspecified external cause status: Secondary | ICD-10-CM | POA: Insufficient documentation

## 2019-06-09 DIAGNOSIS — X500XXA Overexertion from strenuous movement or load, initial encounter: Secondary | ICD-10-CM | POA: Insufficient documentation

## 2019-06-09 DIAGNOSIS — Y929 Unspecified place or not applicable: Secondary | ICD-10-CM | POA: Insufficient documentation

## 2019-06-09 DIAGNOSIS — Y9389 Activity, other specified: Secondary | ICD-10-CM | POA: Insufficient documentation

## 2019-06-09 DIAGNOSIS — Y792 Prosthetic and other implants, materials and accessory orthopedic devices associated with adverse incidents: Secondary | ICD-10-CM | POA: Insufficient documentation

## 2019-06-09 DIAGNOSIS — R52 Pain, unspecified: Secondary | ICD-10-CM

## 2019-06-09 LAB — BASIC METABOLIC PANEL
Anion gap: 7 (ref 5–15)
BUN: 18 mg/dL (ref 6–20)
CO2: 29 mmol/L (ref 22–32)
Calcium: 9.1 mg/dL (ref 8.9–10.3)
Chloride: 105 mmol/L (ref 98–111)
Creatinine, Ser: 1.02 mg/dL — ABNORMAL HIGH (ref 0.44–1.00)
GFR calc Af Amer: >60 mL/min (ref >60)
GFR calc non Af Amer: >60 mL/min (ref >60)
Glucose, Bld: 97 mg/dL (ref 70–99)
Potassium: 3.3 mmol/L — ABNORMAL LOW (ref 3.5–5.1)
Sodium: 141 mmol/L (ref 135–145)

## 2019-06-09 LAB — I-STAT BETA HCG BLOOD, ED (MC, WL, AP ONLY): I-stat hCG, quantitative: <5 m[IU]/mL (ref <5)

## 2019-06-09 LAB — CBC WITH DIFFERENTIAL/PLATELET
Abs Immature Granulocytes: 0.02 10*3/uL (ref 0.00–0.07)
Basophils Absolute: 0 10*3/uL (ref 0.0–0.1)
Basophils Relative: 1 %
Eosinophils Absolute: 0.5 10*3/uL (ref 0.0–0.5)
Eosinophils Relative: 6 %
HCT: 38.5 % (ref 36.0–46.0)
Hemoglobin: 12.1 g/dL (ref 12.0–15.0)
Immature Granulocytes: 0 %
Lymphocytes Relative: 29 %
Lymphs Abs: 2.2 10*3/uL (ref 0.7–4.0)
MCH: 29.7 pg (ref 26.0–34.0)
MCHC: 31.4 g/dL (ref 30.0–36.0)
MCV: 94.6 fL (ref 80.0–100.0)
Monocytes Absolute: 0.6 10*3/uL (ref 0.1–1.0)
Monocytes Relative: 7 %
Neutro Abs: 4.4 10*3/uL (ref 1.7–7.7)
Neutrophils Relative %: 57 %
Platelets: 232 10*3/uL (ref 150–400)
RBC: 4.07 MIL/uL (ref 3.87–5.11)
RDW: 13.7 % (ref 11.5–15.5)
WBC: 7.6 10*3/uL (ref 4.0–10.5)
nRBC: 0 % (ref 0.0–0.2)

## 2019-06-09 LAB — TYPE AND SCREEN
ABO/RH(D): O POS
Antibody Screen: NEGATIVE

## 2019-06-09 LAB — PROTIME-INR
INR: 1.1 (ref 0.8–1.2)
Prothrombin Time: 14.3 seconds (ref 11.4–15.2)

## 2019-06-09 LAB — ABO/RH: ABO/RH(D): O POS

## 2019-06-09 MED ORDER — PROPOFOL 10 MG/ML IV BOLUS
75.0000 mg | Freq: Once | INTRAVENOUS | Status: DC
  Filled 2019-06-09: qty 20

## 2019-06-09 MED ORDER — HYDROMORPHONE HCL 1 MG/ML IJ SOLN
1.0000 mg | Freq: Once | INTRAMUSCULAR | Status: AC
  Administered 2019-06-09: 1 mg via INTRAVENOUS
  Filled 2019-06-09: qty 1

## 2019-06-09 MED ORDER — PROPOFOL 10 MG/ML IV BOLUS
INTRAVENOUS | Status: AC | PRN
  Administered 2019-06-09: 75 mg via INTRAVENOUS
  Administered 2019-06-09: 50 mg via INTRAVENOUS

## 2019-06-09 MED ORDER — FENTANYL CITRATE (PF) 100 MCG/2ML IJ SOLN
50.0000 ug | Freq: Once | INTRAMUSCULAR | Status: AC
  Administered 2019-06-09: 50 ug via INTRAVENOUS
  Filled 2019-06-09: qty 2

## 2019-06-09 NOTE — Discharge Instructions (Addendum)
Monitor your condition carefully and do not hesitate to return here.  Otherwise, please follow-up with our orthopedic colleagues.

## 2019-06-09 NOTE — ED Notes (Signed)
Electronic consent obtained and witnessed by this RN

## 2019-06-09 NOTE — ED Triage Notes (Signed)
Patient here from home via EMS with complaints of right hip pain. Multiple hip dislocations. Pain 10/10. Reports that she was sitting in a chair and the chair collapsed.

## 2019-06-09 NOTE — ED Provider Notes (Signed)
[place] DEPT Provider Note   CSN: [id number] Arrival date & time: 06/09/19  1351     History Chief Complaint  Patient presents with  . Hip Pain    Linda Chang is a 54 y.o. female.  HPI    Patient presents due to sudden onset left hip pain. Patient was in her usual state of health when she bent over and suddenly her pop, felt severe pain in her left hip. Since that time she has been nonambulatory secondary to pain in the hip.  Pain is severe, minimally improved with 200 mcg of fentanyl provided in route. Patient's hip replacement was performed approximately 20 years ago, is bilateral. No distal loss of sensation or weakness, no other injuries, no other complaints. Past Medical History:  Diagnosis Date  . Anxiety   . Arthritis   . Asthma   . Chronic back pain   . Chronic neck pain   . COPD (chronic obstructive pulmonary disease) ([place])   . Depression    prior hospitalization   . Finger joint swelling   . Morning joint stiffness   . Polyarthralgia   . S/P bilateral hip replacements 2004, 2003  . Tobacco use     There are no problems to display for this patient.   Past Surgical History:  Procedure Laterality Date  . TONSILLECTOMY    . TOTAL HIP ARTHROPLASTY Bilateral      OB History   No obstetric history on file.     No family history on file.  Social History   Tobacco Use  . Smoking status: Current Every Day Smoker    Packs/day: 0.25    Years: 30.00    Pack years: 7.50    Types: Cigarettes  . Smokeless tobacco: Never Used  Substance Use Topics  . Alcohol use: No    Comment: hx/o heavier ETOH use as teen  . Drug use: No    Home Medications Prior to Admission medications   Medication Sig Start Date End Date Taking? Authorizing Provider  albuterol (PROVENTIL HFA;VENTOLIN HFA) 108 (90 Base) MCG/ACT inhaler Inhale 1-2 puffs into the lungs every 6 (six) hours as needed for wheezing or shortness of breath.   Yes  [provider]  Aspirin-Salicylamide-Caffeine (BC HEADACHE PO) Take 1 packet by mouth daily as needed (pain).   Yes [provider]  HYDROcodone-acetaminophen (NORCO/VICODIN) 5-325 MG tablet Take 1 tablet by mouth every 6 (six) hours as needed for severe pain. Patient not taking: Reported on 06/24/2017 03/11/17   Rolland Porter, MD  naproxen (NAPROSYN) 500 MG tablet Take 1 po BID with food prn pain Patient not taking: Reported on 06/24/2017 03/11/17   Rolland Porter, MD    Allergies    Morphine and related  Review of Systems   Review of Systems  Constitutional:       Per HPI, otherwise negative  HENT:       Per HPI, otherwise negative  Respiratory:       Per HPI, otherwise negative  Cardiovascular:       Per HPI, otherwise negative  Gastrointestinal: Negative for vomiting.  Endocrine:       Negative aside from HPI  Genitourinary:       Neg aside from HPI   Musculoskeletal:       Per HPI, otherwise negative  Skin: Negative.   Neurological: Negative for syncope.    Physical Exam Updated Vital Signs BP 118/72   Pulse 60   Temp (!) 97.5 F (  36.4 C) (Oral)   Resp 16   Ht 5\' 7"  (1.702 m)   Wt 90.7 kg   LMP 03/02/2014   SpO2 100%   BMI 31.32 kg/m   Physical Exam Vitals and nursing note reviewed.  Constitutional:      General: She is in acute distress.     Appearance: She is well-developed. She is ill-appearing.     Comments: Uncomfortable appearing adult female awake and alert  HENT:     Head: Normocephalic and atraumatic.  Eyes:     Conjunctiva/sclera: Conjunctivae normal.  Cardiovascular:     Rate and Rhythm: Regular rhythm. Tachycardia present.     Pulses: Normal pulses.  Pulmonary:     Effort: Pulmonary effort is normal. No respiratory distress.     Breath sounds: Normal breath sounds. No stridor.  Abdominal:     General: There is no distension.  Musculoskeletal:       Legs:  Skin:    General: Skin is warm and dry.  Neurological:     Mental  Status: She is alert and oriented to person, place, and time.     Cranial Nerves: No cranial nerve deficit.     ED Results / Procedures / Treatments   Labs (all labs ordered are listed, but only abnormal results are displayed) Labs Reviewed  BASIC METABOLIC PANEL - Abnormal; Notable for the following components:      Result Value   Potassium 3.3 (*)    Creatinine, Ser 1.02 (*)    All other components within normal limits  CBC WITH DIFFERENTIAL/PLATELET  PROTIME-INR  I-STAT BETA HCG BLOOD, ED ([facility], [facility], [facility] ONLY)  TYPE AND SCREEN  ABO/RH    EKG EKG Interpretation  Date/Time:  Monday June 09 2019 14:33:06 EST Ventricular Rate:  67 PR Interval:    QRS Duration: 120 QT Interval:  396 QTC Calculation: 418 R Axis:   93 Text Interpretation: Sinus rhythm Nonspecific intraventricular conduction delay Confirmed by Quintella Reichert [phone]) on 06/09/2019 4:11:26 PM   Radiology DG Hip [place] Pelvis 1 View Left  Result Date: 06/09/2019 CLINICAL DATA:  Status post reduction. EXAM: DG HIP (WITH OR WITHOUT PELVIS) 1V PORT LEFT COMPARISON:  June 09, 2019 FINDINGS: A total left hip replacement is seen without evidence of surrounding lucency to suggest the presence of hardware loosening or infection. There is no evidence of hip fracture or dislocation. Soft tissue structures are unremarkable. IMPRESSION: 1. Interval reduction of the dislocated total left hip replacement seen on the prior study dated June 09, 2019. Electronically Signed   By: Virgina Norfolk M.D.   On: 06/09/2019 16:20   DG HIP UNILAT WITH PELVIS 2-3 VIEWS LEFT  Result Date: 06/09/2019 CLINICAL DATA:  54 year old female with hip pain. EXAM: DG HIP (WITH OR WITHOUT PELVIS) 2-3V LEFT COMPARISON:  Left hip radiograph dated 06/27/2017. FINDINGS: There are total bilateral hip arthroplasties. There is superior dislocation of the left femoral component out of the acetabular cup. There is no acute fracture. The bones  are osteopenic. Soft tissues are unremarkable. IMPRESSION: Dislocated left hip arthroplasty. Electronically Signed   By: Anner Crete M.D.   On: 06/09/2019 15:14    Procedures .Sedation  Date/Time: 06/09/2019 4:00 PM Performed by: Carmin Muskrat, MD Authorized by: Carmin Muskrat, MD   Consent:    Consent obtained:  Verbal and written   Consent given by:  Patient   Risks discussed:  Allergic reaction, dysrhythmia, inadequate sedation, nausea, prolonged hypoxia resulting in organ  damage, prolonged sedation necessitating reversal, respiratory compromise necessitating ventilatory assistance and intubation and vomiting   Alternatives discussed:  Analgesia without sedation, anxiolysis and regional anesthesia Universal protocol:    Procedure explained and questions answered to patient or proxy's satisfaction: yes     Relevant documents present and verified: yes     Test results available and properly labeled: yes     Imaging studies available: yes     Required blood products, implants, devices, and special equipment available: yes     Site/side marked: yes     Immediately prior to procedure a time out was called: yes     Patient identity confirmation method:  Verbally with patient Indications:    Procedure necessitating sedation performed by:  Physician performing sedation Pre-sedation assessment:    Time since last food or drink:  3   ASA classification: class 2 - patient with mild systemic disease     Mouth opening:  2 finger widths   Thyromental distance:  3 finger widths   Mallampati score:  I - soft palate, uvula, fauces, pillars visible   Pre-sedation assessments completed and reviewed: airway patency, cardiovascular function, hydration status, mental status, nausea/vomiting, pain level, respiratory function and temperature     Pre-sedation assessment completed:  06/09/2019 4:00 PM Immediate pre-procedure details:    Reassessment: Patient reassessed immediately prior to  procedure     Reviewed: vital signs, relevant labs/tests and NPO status     Verified: bag valve mask available, emergency equipment available, intubation equipment available, IV patency confirmed, oxygen available and suction available   Procedure details (see MAR for exact dosages):    Preoxygenation:  Nasal cannula and bag valve mask   Sedation:  Propofol   Intended level of sedation: deep   Intra-procedure monitoring:  Blood pressure monitoring, cardiac monitor, continuous pulse oximetry, frequent LOC assessments, frequent vital sign checks and continuous capnometry   Intra-procedure events: none     Total Provider sedation time (minutes):  20 Post-procedure details:    Post-sedation assessment completed:  06/09/2019 4:33 PM   Attendance: Constant attendance by certified staff until patient recovered     Recovery: Patient returned to pre-procedure baseline     Post-sedation assessments completed and reviewed: airway patency, cardiovascular function, hydration status, mental status, nausea/vomiting, pain level, respiratory function and temperature     Patient is stable for discharge or admission: yes     Patient tolerance:  Tolerated well, no immediate complications .Ortho Injury Treatment  Date/Time: 06/09/2019 4:43 PM Performed by: Carmin Muskrat, MD Authorized by: Carmin Muskrat, MD   Consent:    Consent obtained:  Written   Consent given by:  Patient   Risks discussed:  Irreducible dislocation   Alternatives discussed:  No treatment and alternative treatment Universal protocol:    Procedure explained and questions answered to patient or proxy's satisfaction: yes     Relevant documents present and verified: yes     Test results available and properly labeled: yes     Imaging studies available: yes     Required blood products, implants, devices, and special equipment available: yes     Site/side marked: yes     Immediately prior to procedure a time out was called: yes      Patient identity confirmed:  Verbally with patientInjury location: hip Location details: left hip Injury type: dislocation Dislocation type: posterior Spontaneous dislocation: yes Prosthesis: yes Pre-procedure neurovascular assessment: neurovascularly intact Pre-procedure distal perfusion: normal Pre-procedure neurological function: normal Pre-procedure range of motion: reduced  Anesthesia: Local anesthesia used: no  Patient sedated: Yes. Refer to sedation procedure documentation for details of sedation. Manipulation performed: yes Reduction method: traction and counter traction, external rotation and abduction Reduction successful: yes X-ray confirmed reduction: yes Immobilization: splint Splint type: long leg Supplies used: aluminum splint Post-procedure neurovascular assessment: post-procedure neurovascularly intact Post-procedure distal perfusion: normal Post-procedure neurological function: normal Post-procedure range of motion: improved Patient tolerance: patient tolerated the procedure well with no immediate complications    (including critical care time)  Medications Ordered in ED Medications  propofol (DIPRIVAN) 10 mg/mL bolus/IV push 75 mg (has no administration in time range)  HYDROmorphone (DILAUDID) injection 1 mg (1 mg Intravenous Given 06/09/19 1434)  fentaNYL (SUBLIMAZE) injection 50 mcg (50 mcg Intravenous Given 06/09/19 1555)  propofol (DIPRIVAN) 10 mg/mL bolus/IV push (50 mg Intravenous Given 06/09/19 1559)    ED Course  I have reviewed the triage vital signs and the nursing notes.  Pertinent labs & imaging results that were available during my care of the patient were reviewed by me and considered in my medical decision making (see chart for details).    MDM Rules/Calculators/A&P                      4:45 PM Patient awake and alert, having tolerated conscious sedation with reduction of her hip dislocation without complication. We discussed the  importance of following up with a local orthopedist, and the patient will receive these referrals.  This female presents after spontaneous dislocation of prior prosthetic replacement of her hip. Patient awake, alert, has no other complaints per After obtaining consent the patient had successful sedation, reduction of her dislocation.  Patient appropriate for discharge follow-up appropriate monitoring period. Final Clinical Impression(s) / ED Diagnoses Final diagnoses:  Dislocation of hip joint prosthesis, initial encounter [facility])     Carmin Muskrat, MD 06/09/19 1646

## 2019-06-09 NOTE — ED Notes (Signed)
Patient transported to X-ray 

## 2019-06-09 NOTE — ED Notes (Signed)
Pt unable to find ride home at this time.  This RN reached out to LCSW in an attempt to provide pt transportation home d/t pt being unqualified for [place] transportation.  LCSW Johnathan was able to accomodate transportation request, though pt left prior to signing necessary Release of Liability form.  Pt agreeable to transportation arrangements prior to discharge. Pt ambulatory at time of discharge to meet with [employer] driver. No concerns voiced by pt.

## 2019-06-09 NOTE — Progress Notes (Signed)
Orthopedic Tech Progress Note Patient Details:  Linda Chang 07-Jan-1966 [id number]  Ortho Devices Type of Ortho Device: Knee Immobilizer Ortho Device/Splint Location: RLE Ortho Device/Splint Interventions: Ordered, Application   Post Interventions Patient Tolerated: Well Instructions Provided: Care of device, Adjustment of device   Zephan Beauchaine N Kevin Space 06/09/2019, 4:16 PM

## 2020-02-28 ENCOUNTER — Emergency Department (HOSPITAL_COMMUNITY)
Admission: EM | Admit: 2020-02-28 | Discharge: 2020-02-28 | Disposition: A | Discharge status: Home / Self Care | Payer: Self-pay | Attending: Emergency Medicine | Admitting: Emergency Medicine

## 2020-02-28 ENCOUNTER — Encounter (HOSPITAL_COMMUNITY): Payer: Self-pay

## 2020-02-28 ENCOUNTER — Emergency Department (HOSPITAL_COMMUNITY): Payer: Self-pay

## 2020-02-28 DIAGNOSIS — Z96643 Presence of artificial hip joint, bilateral: Secondary | ICD-10-CM | POA: Insufficient documentation

## 2020-02-28 DIAGNOSIS — Z7982 Long term (current) use of aspirin: Secondary | ICD-10-CM | POA: Insufficient documentation

## 2020-02-28 DIAGNOSIS — M25559 Pain in unspecified hip: Secondary | ICD-10-CM

## 2020-02-28 DIAGNOSIS — S73005A Unspecified dislocation of left hip, initial encounter: Secondary | ICD-10-CM | POA: Insufficient documentation

## 2020-02-28 DIAGNOSIS — Y9389 Activity, other specified: Secondary | ICD-10-CM | POA: Insufficient documentation

## 2020-02-28 DIAGNOSIS — J45909 Unspecified asthma, uncomplicated: Secondary | ICD-10-CM | POA: Insufficient documentation

## 2020-02-28 DIAGNOSIS — X501XXA Overexertion from prolonged static or awkward postures, initial encounter: Secondary | ICD-10-CM | POA: Insufficient documentation

## 2020-02-28 DIAGNOSIS — F17210 Nicotine dependence, cigarettes, uncomplicated: Secondary | ICD-10-CM | POA: Insufficient documentation

## 2020-02-28 LAB — I-STAT CHEM 8, ED
BUN: 21 mg/dL — ABNORMAL HIGH (ref 6–20)
Calcium, Ion: 1.24 mmol/L (ref 1.15–1.40)
Chloride: 107 mmol/L (ref 98–111)
Creatinine, Ser: 0.8 mg/dL (ref 0.44–1.00)
Glucose, Bld: 89 mg/dL (ref 70–99)
HCT: 31 % — ABNORMAL LOW (ref 36.0–46.0)
Hemoglobin: 10.5 g/dL — ABNORMAL LOW (ref 12.0–15.0)
Potassium: 3.6 mmol/L (ref 3.5–5.1)
Sodium: 143 mmol/L (ref 135–145)
TCO2: 28 mmol/L (ref 22–32)

## 2020-02-28 MED ORDER — HYDROMORPHONE HCL 1 MG/ML IJ SOLN
1.0000 mg | Freq: Once | INTRAMUSCULAR | Status: AC
  Administered 2020-02-28: 1 mg via INTRAVENOUS
  Filled 2020-02-28: qty 1

## 2020-02-28 MED ORDER — PROPOFOL 10 MG/ML IV BOLUS
0.5000 mg/kg | INTRAVENOUS | Status: DC | PRN
  Filled 2020-02-28: qty 20

## 2020-02-28 MED ORDER — ONDANSETRON HCL 4 MG/2ML IJ SOLN
4.0000 mg | Freq: Once | INTRAMUSCULAR | Status: AC
  Administered 2020-02-28: 4 mg via INTRAVENOUS
  Filled 2020-02-28: qty 2

## 2020-02-28 MED ORDER — PROPOFOL 10 MG/ML IV BOLUS
INTRAVENOUS | Status: AC | PRN
  Administered 2020-02-28 (×3): 45.35 mg via INTRAVENOUS

## 2020-02-28 NOTE — ED Provider Notes (Addendum)
[place] DEPT Provider Note   CSN: [id number] Arrival date & time: 02/28/20  1419     History Chief Complaint  Patient presents with  . Hip Pain    Linda Chang is a 54 y.o. female.  HPI   Pt presents with left hip pain.  She was sitting doing laundry leaning when she felt a pop in her left hip.  She experienced severe pain and cant stand.  Pt has prior hip surgery with prosthetic hip.  Pt has dislocated her hip before and feels like it is dislocated again.  Last po 1 hr ago.  Past Medical History:  Diagnosis Date  . Anxiety   . Arthritis   . Asthma   . Chronic back pain   . Chronic neck pain   . COPD (chronic obstructive pulmonary disease) ([place])   . Depression    prior hospitalization   . Finger joint swelling   . Morning joint stiffness   . Polyarthralgia   . S/P bilateral hip replacements 2004, 2003  . Tobacco use     There are no problems to display for this patient.   Past Surgical History:  Procedure Laterality Date  . TONSILLECTOMY    . TOTAL HIP ARTHROPLASTY Bilateral      OB History   No obstetric history on file.     No family history on file.  Social History   Tobacco Use  . Smoking status: Current Every Day Smoker    Packs/day: 0.25    Years: 30.00    Pack years: 7.50    Types: Cigarettes  . Smokeless tobacco: Never Used  Substance Use Topics  . Alcohol use: No    Comment: hx/o heavier ETOH use as teen  . Drug use: No    Home Medications Prior to Admission medications   Medication Sig Start Date End Date Taking? Authorizing Provider  albuterol (PROVENTIL HFA;VENTOLIN HFA) 108 (90 Base) MCG/ACT inhaler Inhale 1-2 puffs into the lungs every 6 (six) hours as needed for wheezing or shortness of breath.    [provider]  Aspirin-Salicylamide-Caffeine (BC HEADACHE PO) Take 1 packet by mouth daily as needed (pain).    [provider]  HYDROcodone-acetaminophen (NORCO/VICODIN) 5-325  MG tablet Take 1 tablet by mouth every 6 (six) hours as needed for severe pain. Patient not taking: Reported on 06/24/2017 03/11/17   Rolland Porter, MD  naproxen (NAPROSYN) 500 MG tablet Take 1 po BID with food prn pain Patient not taking: Reported on 06/24/2017 03/11/17   Rolland Porter, MD    Allergies    Morphine and related  Review of Systems   Review of Systems  All other systems reviewed and are negative.   Physical Exam Updated Vital Signs BP (!) 108/93   Pulse 74   Temp 97.7 F (36.5 C) (Oral)   Resp 18   Wt 90.7 kg   LMP 03/02/2014   SpO2 100%   BMI 31.32 kg/m   Physical Exam Vitals and nursing note reviewed.  Constitutional:      General: She is not in acute distress.    Appearance: She is well-developed.  HENT:     Head: Normocephalic and atraumatic.     Right Ear: External ear normal.     Left Ear: External ear normal.  Eyes:     General: No scleral icterus.       Right eye: No discharge.        Left eye: No discharge.  Conjunctiva/sclera: Conjunctivae normal.  Neck:     Trachea: No tracheal deviation.  Cardiovascular:     Rate and Rhythm: Normal rate and regular rhythm.  Pulmonary:     Effort: Pulmonary effort is normal. No respiratory distress.     Breath sounds: Normal breath sounds. No stridor. No wheezing or rales.  Abdominal:     General: Bowel sounds are normal. There is no distension.     Palpations: Abdomen is soft.     Tenderness: There is no abdominal tenderness. There is no guarding or rebound.  Musculoskeletal:     Cervical back: Neck supple.     Left hip: Deformity and tenderness present. Decreased range of motion.  Skin:    General: Skin is warm and dry.     Findings: No rash.  Neurological:     Mental Status: She is alert.     Cranial Nerves: No cranial nerve deficit (no facial droop, extraocular movements intact, no slurred speech).     Sensory: No sensory deficit.     Motor: No abnormal muscle tone or seizure activity.      Coordination: Coordination normal.     ED Results / Procedures / Treatments   Labs (all labs ordered are listed, but only abnormal results are displayed) Labs Reviewed  I-STAT CHEM 8, ED - Abnormal; Notable for the following components:      Result Value   BUN 21 (*)    Hemoglobin 10.5 (*)    HCT 31.0 (*)    All other components within normal limits    EKG None  Radiology DG Hip Unilat [place] or [place] Pelvis 1 View Left  Result Date: 02/28/2020 CLINICAL DATA:  Post reduction of left hip. EXAM: DG HIP (WITH OR WITHOUT PELVIS) 1V*L* COMPARISON:  February 28, 2020 2:54 p.m. FINDINGS: Previously noted left hip hardware dislocation has been reduced. No acute fracture or dislocation is identified. IMPRESSION: Previously noted left hip hardware dislocation has been reduced. Electronically Signed   By: Abelardo Diesel M.D.   On: 02/28/2020 16:51   DG HIP UNILAT WITH PELVIS 2-3 VIEWS LEFT  Result Date: 02/28/2020 CLINICAL DATA:  Patient reports left hip popping out of joint. History of bilateral total hip arthroplasty. EXAM: DG HIP (WITH OR WITHOUT PELVIS) 2-3V LEFT COMPARISON:  Radiographs 06/09/2019. FINDINGS: Status post bilateral total hip arthroplasty. There is recurrent posterolateral dislocation of the left total hip arthroplasty without evidence of acute fracture or hardware loosening. The right hip remains located. The distal end of the right femoral prosthesis is not imaged. The bony pelvis appears intact. 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 Electronically Signed   By: Richardean Sale M.D.   On: 02/28/2020 15:28    Procedures .Sedation  Date/Time: 02/28/2020 5:59 PM Performed by: Dorie Rank, MD Authorized by: Dorie Rank, MD   Consent:    Consent obtained:  Verbal   Consent given by:  Patient   Risks discussed:  Allergic reaction, dysrhythmia, inadequate sedation, nausea, prolonged hypoxia resulting in organ damage, prolonged sedation  necessitating reversal, respiratory compromise necessitating ventilatory assistance and intubation and vomiting   Alternatives discussed:  Analgesia without sedation, anxiolysis and regional anesthesia Universal protocol:    Procedure explained and questions answered to patient or proxy's satisfaction: yes     Relevant documents present and verified: yes     Test results available and properly labeled: yes     Imaging studies available: yes     Required blood products, implants, devices,  and special equipment available: yes     Site/side marked: yes     Immediately prior to procedure a time out was called: yes     Patient identity confirmation method:  Verbally with patient Indications:    Procedure performed:  Dislocation reduction   Procedure necessitating sedation performed by:  Physician performing sedation Pre-sedation assessment:    Time since last food or drink:  1   NPO status caution: urgency dictates proceeding with non-ideal NPO status     ASA classification: class 1 - normal, healthy patient     Neck mobility: normal     Mouth opening:  3 or more finger widths   Thyromental distance:  4 finger widths   Mallampati score:  I - soft palate, uvula, fauces, pillars visible   Pre-sedation assessments completed and reviewed: airway patency, cardiovascular function, hydration status, mental status, nausea/vomiting, pain level, respiratory function and temperature     Pre-sedation assessment completed:  02/28/2020 2:40 PM Immediate pre-procedure details:    Reassessment: Patient reassessed immediately prior to procedure     Reviewed: vital signs, relevant labs/tests and NPO status     Verified: bag valve mask available, emergency equipment available, intubation equipment available, IV patency confirmed, oxygen available and suction available   Procedure details (see MAR for exact dosages):    Preoxygenation:  Nasal cannula   Sedation:  Propofol   Intended level of sedation: deep    Intra-procedure monitoring:  Blood pressure monitoring, cardiac monitor, continuous pulse oximetry, frequent LOC assessments, frequent vital sign checks and continuous capnometry   Intra-procedure events: none     Total Provider sedation time (minutes):  35 Post-procedure details:    Post-sedation assessment completed:  02/28/2020 5:59 PM   Attendance: Constant attendance by certified staff until patient recovered     Recovery: Patient returned to pre-procedure baseline     Post-sedation assessments completed and reviewed: airway patency, cardiovascular function, hydration status, mental status, nausea/vomiting, pain level, respiratory function and temperature     Patient is stable for discharge or admission: yes     Patient tolerance:  Tolerated well, no immediate complications   (including critical care time)  Medications Ordered in ED Medications  propofol (DIPRIVAN) 10 mg/mL bolus/IV push 45.4 mg (has no administration in time range)  HYDROmorphone (DILAUDID) injection 1 mg (1 mg Intravenous Given 02/28/20 1444)  ondansetron (ZOFRAN) injection 4 mg (4 mg Intravenous Given 02/28/20 1444)  propofol (DIPRIVAN) 10 mg/mL bolus/IV push (45.35 mg Intravenous Given 02/28/20 1617)    ED Course  I have reviewed the triage vital signs and the nursing notes.  Pertinent labs & imaging results that were available during my care of the patient were reviewed by me and considered in my medical decision making (see chart for details).  Clinical Course as of Feb 27 1799  Sat Feb 28, 2020  1728 Xrays reviewed.  Initial hip dislocation now reduced   [JK]    Clinical Course User Index [JK] Dorie Rank, MD   MDM Rules/Calculators/A&P                          Patient presented to ED for recurrent hip dislocation.  Patient has history of this following her static hip surgery.  Patient was successfully reduced under propofol sedation.  Hip reduction performed by PA Morelli and myself.  Patient tolerated  procedure well.  Will discharge home with crutches and knee immobilizer.  Recommend outpatient follow-up with orthopedic to  discuss further treatment regarding her recurrent hip dislocations. Final Clinical Impression(s) / ED Diagnoses Final diagnoses:  Dislocation of left hip, initial encounter [facility]    Rx / DC Orders ED Discharge Orders    None       Dorie Rank, MD 02/28/20 1801    Dorie Rank, MD 03/15/20 [phone]

## 2020-02-28 NOTE — Discharge Instructions (Signed)
Follow-up with your orthopedic doctor to discuss further actions regarding your recurrent dislocations.

## 2020-02-28 NOTE — ED Notes (Signed)
Ortho called for crutches and knee immobilizer application.

## 2020-02-28 NOTE — ED Provider Notes (Signed)
.  Ortho Injury Treatment  Date/Time: 02/28/2020 5:28 PM Performed by: Deliah Boston, PA-C Authorized by: Deliah Boston, PA-C   Consent:    Consent obtained:  Verbal   Consent given by:  Patient   Risks discussed:  Fracture, irreducible dislocation, recurrent dislocation, nerve damage, stiffness, restricted joint movement and vascular damageInjury location: hip Location details: left hip Injury type: dislocation Dislocation type: posterior Spontaneous dislocation: yes Prosthesis: yes Pre-procedure neurovascular assessment: neurovascularly intact  Patient sedated: Yes. Refer to sedation procedure documentation for details of sedation. Manipulation performed: yes Reduction method: Allis maneuver Reduction successful: yes X-ray confirmed reduction: yes Immobilization: brace Post-procedure neurovascular assessment: post-procedure neurovascularly intact Patient tolerance: patient tolerated the procedure well with no immediate complications Comments: Sedation and assistance by Dr. Hillard Danker     Note: Portions of this report may have been transcribed using voice recognition software. Every effort was made to ensure accuracy; however, inadvertent computerized transcription errors may still be present.   Linda Chang 02/28/20 1732    Dorie Rank, MD 02/28/20 1759

## 2020-02-28 NOTE — ED Triage Notes (Signed)
She states she felt her left "hip pop out" upon bending over as she sat on her bed to grasp some laundry. She states she has had bilat. Hip replacement surgery by a physician in [place]. She also tells [place] her left hip "has come out a bunch of times". She rec'd. 200 mcg of Fentanyl en route to hospital and arrives in no distress. CMS intact feet and all toes bilat.

## 2020-12-20 ENCOUNTER — Emergency Department (HOSPITAL_COMMUNITY): Payer: Self-pay

## 2020-12-20 ENCOUNTER — Encounter (HOSPITAL_COMMUNITY): Payer: Self-pay | Admitting: Emergency Medicine

## 2020-12-20 ENCOUNTER — Inpatient Hospital Stay (HOSPITAL_COMMUNITY)
Admission: EM | Admit: 2020-12-20 | Discharge: 2020-12-23 | DRG: 871 | Disposition: A | Discharge status: Home / Self Care | Payer: Self-pay | Attending: Internal Medicine | Admitting: Internal Medicine

## 2020-12-20 ENCOUNTER — Other Ambulatory Visit: Payer: Self-pay

## 2020-12-20 DIAGNOSIS — Z28310 Unvaccinated for covid-19: Secondary | ICD-10-CM

## 2020-12-20 DIAGNOSIS — F32A Depression, unspecified: Secondary | ICD-10-CM | POA: Diagnosis present

## 2020-12-20 DIAGNOSIS — Z79899 Other long term (current) drug therapy: Secondary | ICD-10-CM

## 2020-12-20 DIAGNOSIS — F419 Anxiety disorder, unspecified: Secondary | ICD-10-CM | POA: Diagnosis present

## 2020-12-20 DIAGNOSIS — N12 Tubulo-interstitial nephritis, not specified as acute or chronic: Secondary | ICD-10-CM

## 2020-12-20 DIAGNOSIS — C259 Malignant neoplasm of pancreas, unspecified: Secondary | ICD-10-CM

## 2020-12-20 DIAGNOSIS — Z6828 Body mass index (BMI) 28.0-28.9, adult: Secondary | ICD-10-CM

## 2020-12-20 DIAGNOSIS — R7881 Bacteremia: Secondary | ICD-10-CM | POA: Diagnosis present

## 2020-12-20 DIAGNOSIS — A4151 Sepsis due to Escherichia coli [E. coli]: Principal | ICD-10-CM | POA: Diagnosis present

## 2020-12-20 DIAGNOSIS — F17200 Nicotine dependence, unspecified, uncomplicated: Secondary | ICD-10-CM | POA: Diagnosis present

## 2020-12-20 DIAGNOSIS — Z96643 Presence of artificial hip joint, bilateral: Secondary | ICD-10-CM | POA: Diagnosis present

## 2020-12-20 DIAGNOSIS — K8689 Other specified diseases of pancreas: Secondary | ICD-10-CM | POA: Diagnosis present

## 2020-12-20 DIAGNOSIS — Z885 Allergy status to narcotic agent status: Secondary | ICD-10-CM

## 2020-12-20 DIAGNOSIS — G8929 Other chronic pain: Secondary | ICD-10-CM | POA: Diagnosis present

## 2020-12-20 DIAGNOSIS — Z20822 Contact with and (suspected) exposure to covid-19: Secondary | ICD-10-CM | POA: Diagnosis present

## 2020-12-20 DIAGNOSIS — J449 Chronic obstructive pulmonary disease, unspecified: Secondary | ICD-10-CM | POA: Diagnosis present

## 2020-12-20 DIAGNOSIS — Q453 Other congenital malformations of pancreas and pancreatic duct: Secondary | ICD-10-CM

## 2020-12-20 DIAGNOSIS — E663 Overweight: Secondary | ICD-10-CM | POA: Diagnosis present

## 2020-12-20 DIAGNOSIS — R197 Diarrhea, unspecified: Secondary | ICD-10-CM | POA: Diagnosis not present

## 2020-12-20 DIAGNOSIS — F17210 Nicotine dependence, cigarettes, uncomplicated: Secondary | ICD-10-CM | POA: Diagnosis present

## 2020-12-20 DIAGNOSIS — Z716 Tobacco abuse counseling: Secondary | ICD-10-CM

## 2020-12-20 DIAGNOSIS — M1990 Unspecified osteoarthritis, unspecified site: Secondary | ICD-10-CM | POA: Diagnosis present

## 2020-12-20 DIAGNOSIS — Z791 Long term (current) use of non-steroidal anti-inflammatories (NSAID): Secondary | ICD-10-CM

## 2020-12-20 DIAGNOSIS — A419 Sepsis, unspecified organism: Secondary | ICD-10-CM

## 2020-12-20 DIAGNOSIS — Z597 Insufficient social insurance and welfare support: Secondary | ICD-10-CM

## 2020-12-20 DIAGNOSIS — E876 Hypokalemia: Secondary | ICD-10-CM | POA: Diagnosis present

## 2020-12-20 DIAGNOSIS — K861 Other chronic pancreatitis: Secondary | ICD-10-CM | POA: Diagnosis present

## 2020-12-20 DIAGNOSIS — N390 Urinary tract infection, site not specified: Secondary | ICD-10-CM | POA: Diagnosis present

## 2020-12-20 DIAGNOSIS — N151 Renal and perinephric abscess: Secondary | ICD-10-CM | POA: Diagnosis present

## 2020-12-20 DIAGNOSIS — N10 Acute tubulo-interstitial nephritis: Secondary | ICD-10-CM | POA: Diagnosis present

## 2020-12-20 DIAGNOSIS — R739 Hyperglycemia, unspecified: Secondary | ICD-10-CM | POA: Diagnosis present

## 2020-12-20 LAB — CBC WITH DIFFERENTIAL/PLATELET
Abs Immature Granulocytes: 0.04 10*3/uL (ref 0.00–0.07)
Basophils Absolute: 0 10*3/uL (ref 0.0–0.1)
Basophils Relative: 0 %
Eosinophils Absolute: 0 10*3/uL (ref 0.0–0.5)
Eosinophils Relative: 0 %
HCT: 39.2 % (ref 36.0–46.0)
Hemoglobin: 12.8 g/dL (ref 12.0–15.0)
Immature Granulocytes: 0 %
Lymphocytes Relative: 4 %
Lymphs Abs: 0.4 10*3/uL — ABNORMAL LOW (ref 0.7–4.0)
MCH: 29.6 pg (ref 26.0–34.0)
MCHC: 32.7 g/dL (ref 30.0–36.0)
MCV: 90.5 fL (ref 80.0–100.0)
Monocytes Absolute: 0.7 10*3/uL (ref 0.1–1.0)
Monocytes Relative: 7 %
Neutro Abs: 9.7 10*3/uL — ABNORMAL HIGH (ref 1.7–7.7)
Neutrophils Relative %: 89 %
Platelets: 214 10*3/uL (ref 150–400)
RBC: 4.33 MIL/uL (ref 3.87–5.11)
RDW: 12.8 % (ref 11.5–15.5)
WBC: 11 10*3/uL — ABNORMAL HIGH (ref 4.0–10.5)
nRBC: 0 % (ref 0.0–0.2)

## 2020-12-20 LAB — URINALYSIS, ROUTINE W REFLEX MICROSCOPIC
Bilirubin Urine: NEGATIVE
Glucose, UA: NEGATIVE mg/dL
Ketones, ur: NEGATIVE mg/dL
Nitrite: POSITIVE — AB
Protein, ur: 30 mg/dL — AB
Specific Gravity, Urine: 1.009 (ref 1.005–1.030)
WBC, UA: >50 WBC/hpf — ABNORMAL HIGH (ref 0–5)
pH: 7 (ref 5.0–8.0)

## 2020-12-20 LAB — LACTIC ACID, PLASMA: Lactic Acid, Venous: 0.5 mmol/L (ref 0.5–1.9)

## 2020-12-20 LAB — COMPREHENSIVE METABOLIC PANEL
ALT: 15 U/L (ref 0–44)
AST: 14 U/L — ABNORMAL LOW (ref 15–41)
Albumin: 3.5 g/dL (ref 3.5–5.0)
Alkaline Phosphatase: 85 U/L (ref 38–126)
Anion gap: 11 (ref 5–15)
BUN: 10 mg/dL (ref 6–20)
CO2: 25 mmol/L (ref 22–32)
Calcium: 9.2 mg/dL (ref 8.9–10.3)
Chloride: 103 mmol/L (ref 98–111)
Creatinine, Ser: 0.97 mg/dL (ref 0.44–1.00)
GFR, Estimated: >60 mL/min (ref >60)
Glucose, Bld: 173 mg/dL — ABNORMAL HIGH (ref 70–99)
Potassium: 3.4 mmol/L — ABNORMAL LOW (ref 3.5–5.1)
Sodium: 139 mmol/L (ref 135–145)
Total Bilirubin: 0.4 mg/dL (ref 0.3–1.2)
Total Protein: 6.8 g/dL (ref 6.5–8.1)

## 2020-12-20 LAB — RESP PANEL BY RT-PCR (FLU A&B, COVID) ARPGX2
Influenza A by PCR: NEGATIVE
Influenza B by PCR: NEGATIVE
SARS Coronavirus 2 by RT PCR: NEGATIVE

## 2020-12-20 MED ORDER — SODIUM CHLORIDE 0.9 % IV SOLN
1.0000 g | Freq: Once | INTRAVENOUS | Status: AC
  Administered 2020-12-20: 1 g via INTRAVENOUS
  Filled 2020-12-20: qty 10

## 2020-12-20 MED ORDER — IBUPROFEN 400 MG PO TABS
600.0000 mg | ORAL_TABLET | Freq: Three times a day (TID) | ORAL | Status: DC | PRN
  Administered 2020-12-20 – 2020-12-22 (×4): 600 mg via ORAL
  Filled 2020-12-20 (×4): qty 1

## 2020-12-20 MED ORDER — ALBUTEROL SULFATE (2.5 MG/3ML) 0.083% IN NEBU: 3.0000 mL | INHALATION_SOLUTION | Freq: Four times a day (QID) | RESPIRATORY_TRACT | Status: DC | PRN

## 2020-12-20 MED ORDER — IBUPROFEN 200 MG PO TABS
600.0000 mg | ORAL_TABLET | Freq: Once | ORAL | Status: AC
  Administered 2020-12-20: 600 mg via ORAL
  Filled 2020-12-20: qty 3

## 2020-12-20 MED ORDER — NICOTINE 14 MG/24HR TD PT24
14.0000 mg | MEDICATED_PATCH | Freq: Every day | TRANSDERMAL | Status: DC
  Administered 2020-12-20 – 2020-12-23 (×4): 14 mg via TRANSDERMAL
  Filled 2020-12-20 (×4): qty 1

## 2020-12-20 MED ORDER — POTASSIUM CHLORIDE 10 MEQ/100ML IV SOLN
10.0000 meq | INTRAVENOUS | Status: AC
  Administered 2020-12-20 (×3): 10 meq via INTRAVENOUS
  Filled 2020-12-20 (×3): qty 100

## 2020-12-20 MED ORDER — ACETAMINOPHEN 500 MG PO TABS
1000.0000 mg | ORAL_TABLET | Freq: Once | ORAL | Status: AC
  Administered 2020-12-20: 1000 mg via ORAL
  Filled 2020-12-20: qty 2

## 2020-12-20 MED ORDER — SODIUM CHLORIDE 0.9 % IV BOLUS
1000.0000 mL | Freq: Once | INTRAVENOUS | Status: AC
  Administered 2020-12-20: 1000 mL via INTRAVENOUS

## 2020-12-20 MED ORDER — POTASSIUM CHLORIDE IN NACL 20-0.9 MEQ/L-% IV SOLN
INTRAVENOUS | Status: DC
  Filled 2020-12-20 (×4): qty 1000

## 2020-12-20 MED ORDER — ONDANSETRON HCL 4 MG PO TABS: 4.0000 mg | ORAL_TABLET | Freq: Four times a day (QID) | ORAL | Status: DC | PRN

## 2020-12-20 MED ORDER — ACETAMINOPHEN 650 MG RE SUPP: 650.0000 mg | Freq: Four times a day (QID) | RECTAL | Status: DC | PRN

## 2020-12-20 MED ORDER — ACETAMINOPHEN 325 MG PO TABS
650.0000 mg | ORAL_TABLET | Freq: Four times a day (QID) | ORAL | Status: DC | PRN
  Administered 2020-12-22 – 2020-12-23 (×2): 650 mg via ORAL
  Filled 2020-12-20 (×2): qty 2

## 2020-12-20 MED ORDER — ONDANSETRON HCL 4 MG/2ML IJ SOLN: 4.0000 mg | Freq: Four times a day (QID) | INTRAMUSCULAR | Status: DC | PRN

## 2020-12-20 MED ORDER — FENTANYL CITRATE (PF) 100 MCG/2ML IJ SOLN
50.0000 ug | Freq: Once | INTRAMUSCULAR | Status: AC
  Administered 2020-12-20: 50 ug via INTRAVENOUS
  Filled 2020-12-20: qty 2

## 2020-12-20 MED ORDER — LOPERAMIDE HCL 2 MG PO CAPS: 2.0000 mg | ORAL_CAPSULE | ORAL | Status: DC | PRN

## 2020-12-20 MED ORDER — ONDANSETRON HCL 4 MG/2ML IJ SOLN
4.0000 mg | Freq: Once | INTRAMUSCULAR | Status: AC
  Administered 2020-12-20: 4 mg via INTRAVENOUS
  Filled 2020-12-20: qty 2

## 2020-12-20 MED ORDER — ENOXAPARIN SODIUM 40 MG/0.4ML IJ SOSY
40.0000 mg | PREFILLED_SYRINGE | Freq: Every day | INTRAMUSCULAR | Status: DC
  Administered 2020-12-20 – 2020-12-23 (×4): 40 mg via SUBCUTANEOUS
  Filled 2020-12-20 (×4): qty 0.4

## 2020-12-20 MED ORDER — CEFTRIAXONE SODIUM 1 G IJ SOLR: 1.0000 g | INTRAMUSCULAR | Status: DC

## 2020-12-20 NOTE — H&P (Addendum)
Admission History and Physical    Linda Chang [id number] DOB: February 18, 1966 DOA: 12/20/2020  PCP: Patient, No Pcp Per (Inactive) Patient coming from: home via [facility] ED  Chief Complaint: abdominal pain   HPI:  55yo with a history of COPD, ongoing tobacco abuse, chronic back pain, and depression/anxiety who presented to the ED with 3 days of left-sided abdominal and flank pain of a sharp nature with intermittent paroxysms of worsening pain.  This was associated with fevers and chills, nausea and vomiting, diarrhea, and dysuria.  This resulted in an intolerance to oral intake with extremely limited to almost no liquids or solids consumed for 24+ hours.  At the time of my admission she reports ongoing anorexia due to her symptoms.  She denies chest pain shortness of breath hematemesis hematochezia or melena.  She is quite somnolent as she reports she has been unable to sleep well for 24+ hours related to her symptoms.  Assessment/Plan  Pyelonephritis / Possible 2.3cm L Kidney Abscess - Sepsis POA Temp 100.6 w/ HR 94 in presence of acute bacterial infection therefore meets sepsis definition - hydrate w/ IVF - empiric IV abx until able to tolerate oral intake - urine culture - consider repeat kidney imaging as clinically improves to evaluate potential L kidney abscess noted on admit CT to rule out need for drainage   Hypokalemia Due to limited oral intake -supplement and follow -check magnesium  Hyperglycemia  No known history of diabetes -likely stress response, though could conceivably be related to pancreatic atrophy noted on CT - check A1c  Calcifications of pancreatic head - pancreatic atrophy Incidental finding -patient denies history of alcohol abuse -check lipase in a.m. -no clear reason she would be having chronic pancreatitis -consider further evaluation with pancreatic protocol MRI potentially as outpatient when more stable  COPD - tobacco abuse  Counseled patient on need to  discontinue tobacco abuse -no acute exacerbation presently -as needed nebs  Depression / Anxiety  Appears well compensated at present  Chronic back pain  Chronic pain reasonably controlled with acute pain related to infection noted above  DVT prophylaxis: Lovenox Code Status: Full Family Communication: No family present at time of admission Disposition Plan:  Admit to Inpatient  Consults called: none indicated  Review of Systems: As per HPI otherwise 10 point review of systems negative.   Past Medical History:  Diagnosis Date   Anxiety    Arthritis    Asthma    Chronic back pain    Chronic neck pain    COPD (chronic obstructive pulmonary disease) ([place])    Depression    prior hospitalization    Finger joint swelling    Morning joint stiffness    Polyarthralgia    S/P bilateral hip replacements 2004, 2003   Tobacco use     Past Surgical History:  Procedure Laterality Date   TONSILLECTOMY     TOTAL HIP ARTHROPLASTY Bilateral     Family History  History reviewed. No pertinent family history.  Social History   reports that she has been smoking cigarettes. She has a 7.50 pack-year smoking history. She has never used smokeless tobacco. She reports that she does not drink alcohol and does not use drugs.  She works as a Educational psychologist.  She lives in [place] with family.  She denies use of alcohol.  Allergies Allergies  Allergen Reactions   Morphine And Related Other (See Comments)    "makes me feel bad"    Prior to Admission medications  Medication Sig Start Date End Date Taking? Authorizing Provider  Aspirin-Salicylamide-Caffeine (BC HEADACHE PO) Take 1 packet by mouth daily as needed (pain).   Yes [provider]  albuterol (PROVENTIL HFA;VENTOLIN HFA) 108 (90 Base) MCG/ACT inhaler Inhale 1-2 puffs into the lungs every 6 (six) hours as needed for wheezing or shortness of breath.    [provider]    Physical Exam: Vitals:   12/20/20 0915 12/20/20  0919 12/20/20 0921 12/20/20 1000  BP: (!) 166/99 (!) 166/99  138/77  Pulse: 90 90  93  Resp:  19  18  Temp:   99.9 F (37.7 C)   TempSrc:   Oral   SpO2: 95% 95%  95%    Constitutional: NAD, calm, somnolent Eyes: PERRL, lids and conjunctivae normal ENMT: Mucous membranes are dry. Posterior pharynx clear of any exudate or lesions. Normal dentition.  Neck: normal, supple, no masses, no thyromegaly Respiratory: clear to auscultation bilaterally, no wheezing, no crackles. Normal respiratory effort. No accessory muscle use.  Cardiovascular: Regular rate and rhythm, no murmurs / rubs / gallops. No extremity edema. 2+ pedal pulses.  Abdomen: Tender to palpation across left lower quadrant and left flank.  No masses or rebound.  Bowel sounds hypoactive.  Nondistended. Musculoskeletal: No clubbing / cyanosis. No joint deformity upper and lower extremities. No contractures. Normal muscle tone.  Skin: No rashes, lesions, ulcers.  Neurologic: CN 2-12 grossly intact B. Sensation intact. Strength 5/5 in all 4 extremities.  Psychiatric: Normal judgment and insight. Alert and oriented x 3. Normal mood.    Labs on Admission:   CBC: Recent Labs  Lab 12/20/20 0744  WBC 11.0*  NEUTROABS 9.7*  HGB 12.8  HCT 39.2  MCV 90.5  PLT [id number]   Basic Metabolic Panel: Recent Labs  Lab 12/20/20 0744  NA 139  K 3.4*  CL 103  CO2 25  GLUCOSE 173*  BUN 10  CREATININE 0.97  CALCIUM 9.2   GFR: CrCl cannot be calculated (Unknown ideal weight.).  Liver Function Tests: Recent Labs  Lab 12/20/20 0744  AST 14*  ALT 15  ALKPHOS 85  BILITOT 0.4  PROT 6.8  ALBUMIN 3.5    Urine analysis:    Component Value Date/Time   COLORURINE YELLOW 12/20/2020 0933   APPEARANCEUR CLOUDY (A) 12/20/2020 0933   LABSPEC 1.009 12/20/2020 0933   PHURINE 7.0 12/20/2020 0933   GLUCOSEU NEGATIVE 12/20/2020 0933   HGBUR MODERATE (A) 12/20/2020 0933   BILIRUBINUR NEGATIVE 12/20/2020 0933   BILIRUBINUR 1+ 10/27/2014  0927   KETONESUR NEGATIVE 12/20/2020 0933   PROTEINUR 30 (A) 12/20/2020 0933   UROBILINOGEN 1.0 10/27/2014 0927   NITRITE POSITIVE (A) 12/20/2020 0933   LEUKOCYTESUR LARGE (A) 12/20/2020 0933     Radiological Exams on Admission: CT RENAL STONE STUDY  Result Date: 12/20/2020 CLINICAL DATA:  Left flank pain EXAM: CT ABDOMEN AND PELVIS WITHOUT CONTRAST TECHNIQUE: Multidetector CT imaging of the abdomen and pelvis was performed following the standard protocol without IV contrast. COMPARISON:  None. FINDINGS: Lower chest: No acute abnormality Hepatobiliary: Subtle high-density material seen within the gallbladder could reflect stones or sludge. No focal hepatic abnormality. Pancreas: Calcifications in the pancreatic head and uncinate process likely reflect changes of chronic pancreatitis. There is pancreatic atrophy. Mild pancreatic ductal dilatation measures up to 9 mm in the pancreatic body. Spleen: No focal abnormality.  Normal size. Adrenals/Urinary Tract: There is mild perinephric stranding surrounding the left kidney, vertically the mid and lower poles where there is a 2.2  cm low-density area in the midpole of the left kidney. No overt hydronephrosis. There is a calcification in the left side of the pelvis measuring 3 mm. It is difficult to follow the distal left ureter due to its decompressed state. I favor this is a phlebolith although a distal left ureteral stone cannot be completely excluded. Adrenal glands and urinary bladder unremarkable. Stomach/Bowel: Normal appendix. Stomach, large and small bowel grossly unremarkable. Vascular/Lymphatic: Aortic atherosclerosis. No evidence of aneurysm or adenopathy. Reproductive: Uterus and adnexa unremarkable.  No mass. Other: No free fluid or free air. Musculoskeletal: No acute bony abnormality. Bilateral hip replacements. IMPRESSION: Calcification in the left side of the pelvis which could reflect small left ureteral stone or phlebolith. The distal ureter is  difficult to follow. Favor phlebolith. 2.3 cm low-density lesion in the mid to lower pole of the left kidney with surrounding perinephric stranding. Cannot exclude infection such as pyelonephritis with small abscess. Recommend clinical correlation for signs of urinary tract infection. Calcifications in the pancreatic head. Pancreatic atrophy and ductal dilatation. This likely is related to chronic pancreatitis but could be further evaluated with pancreatic protocol MRI. High-density material layering within the gallbladder could reflect small stones or sludge. Electronically Signed   By: Rolm Baptise M.D.   On: 12/20/2020 09:11    EKG: not indicated   Cherene Altes, MD [facility] Office  [phone] Pager - Text Page per Amion as per below:  On-Call/Text Page:      [email or web address]  If 7PM-7AM, please contact night-coverage [email or web address] 12/20/2020, 10:44 AM

## 2020-12-20 NOTE — ED Provider Notes (Signed)
[facility] DEPT Provider Note   CSN: [id number] Arrival date & time: 12/20/20  0720     History Chief Complaint  Patient presents with   Flank Pain    Linda Chang is a 55 y.o. female.  The history is provided by the patient and medical records. No language interpreter was used.  Flank Pain    55 year old female with significant history of COPD, chronic back pain, tobacco abuse, anxiety, presenting complaining of abdominal pain.  Patient report for the past 3 days she has had recurrent pain primarily to the left side of abdomen.  Described pain as a sharp sensation, intermittent but has become increasingly more painful since last night.  She also endorsed having fever, chills, nauseous, vomiting, diarrhea, dysuria, and seen trace of blood in the urine.  She also endorsed decrease in appetite.  Pain seems to be worsening with movement.  No specific treatment tried currently.  Rates pain as moderate to severe.  No prior history of kidney stone.  No history of alcohol abuse.  She has not been vaccinated for COVID-19.        Past Medical History:  Diagnosis Date   Anxiety    Arthritis    Asthma    Chronic back pain    Chronic neck pain    COPD (chronic obstructive pulmonary disease) ([place])    Depression    prior hospitalization    Finger joint swelling    Morning joint stiffness    Polyarthralgia    S/P bilateral hip replacements 2004, 2003   Tobacco use     There are no problems to display for this patient.   Past Surgical History:  Procedure Laterality Date   TONSILLECTOMY     TOTAL HIP ARTHROPLASTY Bilateral      OB History   No obstetric history on file.     History reviewed. No pertinent family history.  Social History   Tobacco Use   Smoking status: Every Day    Packs/day: 0.25    Years: 30.00    Pack years: 7.50    Types: Cigarettes   Smokeless tobacco: Never  Substance Use Topics   Alcohol use: No    Comment:  hx/o heavier ETOH use as teen   Drug use: No    Home Medications Prior to Admission medications   Medication Sig Start Date End Date Taking? Authorizing Provider  albuterol (PROVENTIL HFA;VENTOLIN HFA) 108 (90 Base) MCG/ACT inhaler Inhale 1-2 puffs into the lungs every 6 (six) hours as needed for wheezing or shortness of breath.    [provider]  Aspirin-Salicylamide-Caffeine (BC HEADACHE PO) Take 1 packet by mouth daily as needed (pain).    [provider]  HYDROcodone-acetaminophen (NORCO/VICODIN) 5-325 MG tablet Take 1 tablet by mouth every 6 (six) hours as needed for severe pain. Patient not taking: Reported on 06/24/2017 03/11/17   Rolland Porter, MD  naproxen (NAPROSYN) 500 MG tablet Take 1 po BID with food prn pain Patient not taking: Reported on 06/24/2017 03/11/17   Rolland Porter, MD    Allergies    Morphine and related  Review of Systems   Review of Systems  Genitourinary:  Positive for flank pain.  All other systems reviewed and are negative.  Physical Exam Updated Vital Signs BP (!) 155/90   Pulse 94   Temp (!) 100.6 F (38.1 C) (Oral)   Resp 19   LMP 03/02/2014   SpO2 98%   Physical Exam Vitals and  nursing note reviewed.  Constitutional:      General: She is not in acute distress.    Appearance: She is well-developed. She is obese.  HENT:     Head: Atraumatic.  Eyes:     Conjunctiva/sclera: Conjunctivae normal.  Cardiovascular:     Rate and Rhythm: Normal rate and regular rhythm.     Pulses: Normal pulses.     Heart sounds: Normal heart sounds.  Pulmonary:     Effort: Pulmonary effort is normal.  Abdominal:     Palpations: Abdomen is soft.     Tenderness: There is abdominal tenderness (And tenderness to left upper and left lower abdomen on palpation without guarding or rebound tenderness.). There is right CVA tenderness and left CVA tenderness.  Musculoskeletal:        General: Tenderness (L shoulder: tenderness to [facility] joint with decreased arm  raise but no deformity or overlying skin changes.) present.     Cervical back: Neck supple.  Skin:    Findings: No rash.  Neurological:     Mental Status: She is alert. Mental status is at baseline.  Psychiatric:        Mood and Affect: Mood normal.    ED Results / Procedures / Treatments   Labs (all labs ordered are listed, but only abnormal results are displayed) Labs Reviewed  CBC WITH DIFFERENTIAL/PLATELET - Abnormal; Notable for the following components:      Result Value   WBC 11.0 (*)    Neutro Abs 9.7 (*)    Lymphs Abs 0.4 (*)    All other components within normal limits  COMPREHENSIVE METABOLIC PANEL - Abnormal; Notable for the following components:   Potassium 3.4 (*)    Glucose, Bld 173 (*)    AST 14 (*)    All other components within normal limits  URINALYSIS, ROUTINE W REFLEX MICROSCOPIC - Abnormal; Notable for the following components:   APPearance CLOUDY (*)    Hgb urine dipstick MODERATE (*)    Protein, ur 30 (*)    Nitrite POSITIVE (*)    Leukocytes,Ua LARGE (*)    WBC, UA >50 (*)    Bacteria, UA MANY (*)    All other components within normal limits  RESP PANEL BY RT-PCR (FLU A&B, COVID) ARPGX2  CULTURE, BLOOD (ROUTINE X 2)  CULTURE, BLOOD (ROUTINE X 2)  URINE CULTURE  LACTIC ACID, PLASMA  LACTIC ACID, PLASMA    EKG None  Radiology CT RENAL STONE STUDY  Result Date: 12/20/2020 CLINICAL DATA:  Left flank pain EXAM: CT ABDOMEN AND PELVIS WITHOUT CONTRAST TECHNIQUE: Multidetector CT imaging of the abdomen and pelvis was performed following the standard protocol without IV contrast. COMPARISON:  None. FINDINGS: Lower chest: No acute abnormality Hepatobiliary: Subtle high-density material seen within the gallbladder could reflect stones or sludge. No focal hepatic abnormality. Pancreas: Calcifications in the pancreatic head and uncinate process likely reflect changes of chronic pancreatitis. There is pancreatic atrophy. Mild pancreatic ductal dilatation  measures up to 9 mm in the pancreatic body. Spleen: No focal abnormality.  Normal size. Adrenals/Urinary Tract: There is mild perinephric stranding surrounding the left kidney, vertically the mid and lower poles where there is a 2.2 cm low-density area in the midpole of the left kidney. No overt hydronephrosis. There is a calcification in the left side of the pelvis measuring 3 mm. It is difficult to follow the distal left ureter due to its decompressed state. I favor this is a phlebolith although a distal left ureteral  stone cannot be completely excluded. Adrenal glands and urinary bladder unremarkable. Stomach/Bowel: Normal appendix. Stomach, large and small bowel grossly unremarkable. Vascular/Lymphatic: Aortic atherosclerosis. No evidence of aneurysm or adenopathy. Reproductive: Uterus and adnexa unremarkable.  No mass. Other: No free fluid or free air. Musculoskeletal: No acute bony abnormality. Bilateral hip replacements. IMPRESSION: Calcification in the left side of the pelvis which could reflect small left ureteral stone or phlebolith. The distal ureter is difficult to follow. Favor phlebolith. 2.3 cm low-density lesion in the mid to lower pole of the left kidney with surrounding perinephric stranding. Cannot exclude infection such as pyelonephritis with small abscess. Recommend clinical correlation for signs of urinary tract infection. Calcifications in the pancreatic head. Pancreatic atrophy and ductal dilatation. This likely is related to chronic pancreatitis but could be further evaluated with pancreatic protocol MRI. High-density material layering within the gallbladder could reflect small stones or sludge. Electronically Signed   By: Rolm Baptise M.D.   On: 12/20/2020 09:11    Procedures Procedures   Medications Ordered in ED Medications  sodium chloride 0.9 % bolus 1,000 mL (has no administration in time range)  ibuprofen (ADVIL) tablet 600 mg (has no administration in time range)    ED  Course  I have reviewed the triage vital signs and the nursing notes.  Pertinent labs & imaging results that were available during my care of the patient were reviewed by me and considered in my medical decision making (see chart for details).    MDM Rules/Calculators/A&P                           BP (!) 166/99   Pulse 90   Temp 99.9 F (37.7 C) (Oral)   Resp 19   LMP 03/02/2014   SpO2 95%   Final Clinical Impression(s) / ED Diagnoses Final diagnoses:  Pyelonephritis    Rx / DC Orders ED Discharge Orders     None      8:15 AM Patient here with left-sided abdominal pain and back pain.  She also is febrile with documented over temperature of 100.6.  Tylenol given, work-up initiated, will provide pain management and will obtain CT scan for further evaluation.   10:19 AM UA shows cloudy appearance, moderate hemoglobin and urine to 6 along with large leukocyte esterase and many bacteria consistent with urinary tract infection.  Urine culture sent.  White count is elevated 11.  CT scan demonstrate 2.3 cm low-density lesion to the mid to lower pole of left kidney with surrounding perinephric stranding.  Cannot exclude infection such as pyelonephritis with small abscess.  Given UA findings, this is likely pyelonephritis with small abscess.  Patient is also febrile but is not tachycardic or hypotensive at this time.  However, will obtain lactic acid, obtain blood culture as well as initiate IV Rocephin.  We will also hydrate patient with IV fluid but she does not require fluid resuscitation at 30 mL/kg.  10:41 AM Appreciate consultation from [facility] Dr. Thereasa Solo who agrees to see and will admit pt for further care.     Domenic Moras, PA-C 12/20/20 1042    Arnaldo Natal, MD 12/20/20 1149

## 2020-12-20 NOTE — ED Notes (Signed)
Patient ambulatory to restroom with no assistance.

## 2020-12-20 NOTE — ED Triage Notes (Signed)
BIBA Per EMS: Pt coming from [address] with complaints of L sided abd and flank pain x 24hours. N/V/D  148/80 90 HR  16 RR  98% RA  98.3 temp

## 2020-12-21 ENCOUNTER — Encounter (HOSPITAL_COMMUNITY): Payer: Self-pay | Admitting: Internal Medicine

## 2020-12-21 ENCOUNTER — Inpatient Hospital Stay (HOSPITAL_COMMUNITY): Payer: Self-pay

## 2020-12-21 DIAGNOSIS — Q453 Other congenital malformations of pancreas and pancreatic duct: Secondary | ICD-10-CM

## 2020-12-21 DIAGNOSIS — J410 Simple chronic bronchitis: Secondary | ICD-10-CM

## 2020-12-21 DIAGNOSIS — F17200 Nicotine dependence, unspecified, uncomplicated: Secondary | ICD-10-CM | POA: Diagnosis present

## 2020-12-21 DIAGNOSIS — A4151 Sepsis due to Escherichia coli [E. coli]: Secondary | ICD-10-CM | POA: Diagnosis present

## 2020-12-21 DIAGNOSIS — R7881 Bacteremia: Secondary | ICD-10-CM

## 2020-12-21 DIAGNOSIS — N390 Urinary tract infection, site not specified: Secondary | ICD-10-CM

## 2020-12-21 DIAGNOSIS — B9620 Unspecified Escherichia coli [E. coli] as the cause of diseases classified elsewhere: Secondary | ICD-10-CM

## 2020-12-21 DIAGNOSIS — K8689 Other specified diseases of pancreas: Secondary | ICD-10-CM | POA: Diagnosis present

## 2020-12-21 DIAGNOSIS — J449 Chronic obstructive pulmonary disease, unspecified: Secondary | ICD-10-CM | POA: Diagnosis present

## 2020-12-21 LAB — BLOOD CULTURE ID PANEL (REFLEXED) - BCID2

## 2020-12-21 LAB — MAGNESIUM: Magnesium: 1.9 mg/dL (ref 1.7–2.4)

## 2020-12-21 LAB — CBC
HCT: 35.4 % — ABNORMAL LOW (ref 36.0–46.0)
Hemoglobin: 11.4 g/dL — ABNORMAL LOW (ref 12.0–15.0)
MCH: 29.8 pg (ref 26.0–34.0)
MCHC: 32.2 g/dL (ref 30.0–36.0)
MCV: 92.7 fL (ref 80.0–100.0)
Platelets: 167 10*3/uL (ref 150–400)
RBC: 3.82 MIL/uL — ABNORMAL LOW (ref 3.87–5.11)
RDW: 13.3 % (ref 11.5–15.5)
WBC: 14.7 10*3/uL — ABNORMAL HIGH (ref 4.0–10.5)
nRBC: 0 % (ref 0.0–0.2)

## 2020-12-21 LAB — COMPREHENSIVE METABOLIC PANEL
ALT: 17 U/L (ref 0–44)
AST: 16 U/L (ref 15–41)
Albumin: 2.6 g/dL — ABNORMAL LOW (ref 3.5–5.0)
Alkaline Phosphatase: 95 U/L (ref 38–126)
Anion gap: 5 (ref 5–15)
BUN: 15 mg/dL (ref 6–20)
CO2: 23 mmol/L (ref 22–32)
Calcium: 7.9 mg/dL — ABNORMAL LOW (ref 8.9–10.3)
Chloride: 112 mmol/L — ABNORMAL HIGH (ref 98–111)
Creatinine, Ser: 0.88 mg/dL (ref 0.44–1.00)
GFR, Estimated: >60 mL/min (ref >60)
Glucose, Bld: 101 mg/dL — ABNORMAL HIGH (ref 70–99)
Potassium: 4 mmol/L (ref 3.5–5.1)
Sodium: 140 mmol/L (ref 135–145)
Total Bilirubin: 0.5 mg/dL (ref 0.3–1.2)
Total Protein: 5.3 g/dL — ABNORMAL LOW (ref 6.5–8.1)

## 2020-12-21 LAB — HEMOGLOBIN A1C
Hgb A1c MFr Bld: 6 % — ABNORMAL HIGH (ref 4.8–5.6)
Mean Plasma Glucose: 125.5 mg/dL

## 2020-12-21 LAB — HIV ANTIBODY (ROUTINE TESTING W REFLEX): HIV Screen 4th Generation wRfx: NONREACTIVE

## 2020-12-21 LAB — LIPASE, BLOOD: Lipase: 19 U/L (ref 11–51)

## 2020-12-21 LAB — MRSA NEXT GEN BY PCR, NASAL: MRSA by PCR Next Gen: DETECTED — AB

## 2020-12-21 MED ORDER — IOHEXOL 9 MG/ML PO SOLN
500.0000 mL | ORAL | Status: AC
Start: 2020-12-21 — End: 2020-12-21
  Administered 2020-12-21: 500 mL via ORAL

## 2020-12-21 MED ORDER — LACTATED RINGERS IV SOLN: INTRAVENOUS | Status: AC

## 2020-12-21 MED ORDER — CEFTRIAXONE SODIUM 2 G IJ SOLR
2.0000 g | INTRAMUSCULAR | Status: DC
  Administered 2020-12-21 – 2020-12-23 (×3): 2 g via INTRAVENOUS
  Filled 2020-12-21: qty 20
  Filled 2020-12-21: qty 2
  Filled 2020-12-21: qty 20

## 2020-12-21 MED ORDER — IOHEXOL 350 MG/ML SOLN
80.0000 mL | Freq: Once | INTRAVENOUS | Status: AC | PRN
  Administered 2020-12-21: 80 mL via INTRAVENOUS

## 2020-12-21 MED ORDER — IOHEXOL 9 MG/ML PO SOLN
ORAL | Status: AC
  Administered 2020-12-21: 500 mL
  Filled 2020-12-21: qty 1000

## 2020-12-21 MED ORDER — MUPIROCIN 2 % EX OINT
1.0000 "application " | TOPICAL_OINTMENT | Freq: Two times a day (BID) | CUTANEOUS | Status: DC
  Administered 2020-12-21 – 2020-12-22 (×3): 1 via NASAL
  Filled 2020-12-21: qty 22

## 2020-12-21 MED ORDER — CHLORHEXIDINE GLUCONATE CLOTH 2 % EX PADS
6.0000 | MEDICATED_PAD | Freq: Every day | CUTANEOUS | Status: DC
  Administered 2020-12-22: 6 via TOPICAL

## 2020-12-21 NOTE — Consult Note (Signed)
H&P Physician requesting consult: Ripudeep Rai  Chief Complaint: Pyelonephritis, left renal abscess  History of Present Illness: 55 year old female with a history of COPD presented with 3 days of left-sided abdominal and flank pain, dysuria, and fevers.  She was also having poor p.o. intake.  She underwent CT scan of the abdomen and pelvis without contrast yesterday that revealed a pelvic calcification favoring a phlebolith rather than ureteral calculus.  There was no left-sided hydronephrosis down to this 3 mm calcification.  She was also found to have a 2.3 cm low-density lesion in the left kidney with surrounding perinephric stranding for which small abscess could not be excluded.  He subsequent underwent a CT scan with contrast.  Official read not back yet but I spoke with interventional radiology and it does not look like there is fluid to aspirate.  Recommendation was for observation with antibiotics.  Blood cultures are currently positive for E. coli.  He is on ceftriaxone.  Final cultures pending.  Urine culture also growing E. coli.  Susceptibilities pending.  She states that she is feeling much improved.  No longer having pain or dysuria currently.  Past Medical History:  Diagnosis Date   Anxiety    Arthritis    Asthma    Chronic back pain    Chronic neck pain    COPD (chronic obstructive pulmonary disease) ([place])    Depression    prior hospitalization    Finger joint swelling    Morning joint stiffness    Polyarthralgia    S/P bilateral hip replacements 2004, 2003   Tobacco use    Past Surgical History:  Procedure Laterality Date   TONSILLECTOMY     TOTAL HIP ARTHROPLASTY Bilateral     Home Medications:  Medications Prior to Admission  Medication Sig Dispense Refill Last Dose   Aspirin-Salicylamide-Caffeine (BC HEADACHE PO) Take 1 packet by mouth daily as needed (pain).   unk   albuterol (PROVENTIL HFA;VENTOLIN HFA) 108 (90 Base) MCG/ACT inhaler Inhale 1-2 puffs into the  lungs every 6 (six) hours as needed for wheezing or shortness of breath.      Allergies:  Allergies  Allergen Reactions   Morphine And Related Other (See Comments)    "makes me feel bad"    History reviewed. No pertinent family history. Social History:  reports that she has been smoking cigarettes. She has a 7.50 pack-year smoking history. She has never used smokeless tobacco. She reports that she does not drink alcohol and does not use drugs.  ROS: A complete review of systems was performed.  All systems are negative except for pertinent findings as noted. ROS   Physical Exam:  Vital signs in last 24 hours: Temp:  [97.5 F (36.4 C)-99.8 F (37.7 C)] 97.5 F (36.4 C) (08/02 1414) Pulse Rate:  [85-102] 85 (08/02 1414) Resp:  [17-19] 18 (08/02 1414) BP: (95-125)/(59-88) 108/73 (08/02 1414) SpO2:  [96 %-100 %] 100 % (08/02 1414) Weight:  [84.2 kg] 84.2 kg (08/01 1954) General:  Alert and oriented, No acute distress HEENT: Normocephalic, atraumatic Neck: No JVD or lymphadenopathy Cardiovascular: Regular rate and rhythm Lungs: Regular rate and effort Abdomen: Soft, nontender, nondistended, no abdominal masses Back: No CVA tenderness Extremities: No edema Neurologic: Grossly intact  Laboratory Data:  Results for orders placed or performed during the hospital encounter of 12/20/20 (from the past 24 hour(s))  HIV Antibody (routine testing w rflx)     Status: None   Collection Time: 12/21/20  4:08 AM  Result Value  Ref Range   HIV Screen 4th Generation wRfx Non Reactive Non Reactive  Hemoglobin A1c     Status: Abnormal   Collection Time: 12/21/20  4:08 AM  Result Value Ref Range   Hgb A1c MFr Bld 6.0 (H) 4.8 - 5.6 %   Mean Plasma Glucose 125.5 mg/dL  Comprehensive metabolic panel     Status: Abnormal   Collection Time: 12/21/20  4:08 AM  Result Value Ref Range   Sodium 140 135 - 145 mmol/L   Potassium 4.0 3.5 - 5.1 mmol/L   Chloride 112 (H) 98 - 111 mmol/L   CO2 23 22 -  32 mmol/L   Glucose, Bld 101 (H) 70 - 99 mg/dL   BUN 15 6 - 20 mg/dL   Creatinine, Ser 0.88 0.44 - 1.00 mg/dL   Calcium 7.9 (L) 8.9 - 10.3 mg/dL   Total Protein 5.3 (L) 6.5 - 8.1 g/dL   Albumin 2.6 (L) 3.5 - 5.0 g/dL   AST 16 15 - 41 U/L   ALT 17 0 - 44 U/L   Alkaline Phosphatase 95 38 - 126 U/L   Total Bilirubin 0.5 0.3 - 1.2 mg/dL   GFR, Estimated >60 >60 mL/min   Anion gap 5 5 - 15  Lipase, blood     Status: None   Collection Time: 12/21/20  4:08 AM  Result Value Ref Range   Lipase 19 11 - 51 U/L  Magnesium     Status: None   Collection Time: 12/21/20  4:08 AM  Result Value Ref Range   Magnesium 1.9 1.7 - 2.4 mg/dL  CBC     Status: Abnormal   Collection Time: 12/21/20  4:08 AM  Result Value Ref Range   WBC 14.7 (H) 4.0 - 10.5 K/uL   RBC 3.82 (L) 3.87 - 5.11 MIL/uL   Hemoglobin 11.4 (L) 12.0 - 15.0 g/dL   HCT 35.4 (L) 36.0 - 46.0 %   MCV 92.7 80.0 - 100.0 fL   MCH 29.8 26.0 - 34.0 pg   MCHC 32.2 30.0 - 36.0 g/dL   RDW 13.3 11.5 - 15.5 %   Platelets 167 150 - 400 K/uL   nRBC 0.0 0.0 - 0.2 %   Recent Results (from the past 240 hour(s))  Resp Panel by RT-PCR (Flu A&B, Covid) Nasopharyngeal Swab     Status: None   Collection Time: 12/20/20  7:58 AM   Specimen: Nasopharyngeal Swab; Nasopharyngeal(NP) swabs in vial transport medium  Result Value Ref Range Status   SARS Coronavirus 2 by RT PCR NEGATIVE NEGATIVE Final    Comment: (NOTE) SARS-CoV-2 target nucleic acids are NOT DETECTED.  The SARS-CoV-2 RNA is generally detectable in upper respiratory specimens during the acute phase of infection. The lowest concentration of SARS-CoV-2 viral copies this assay can detect is 138 copies/mL. A negative result does not preclude SARS-Cov-2 infection and should not be used as the sole basis for treatment or other patient management decisions. A negative result may occur with  improper specimen collection/handling, submission of specimen other than nasopharyngeal swab, presence of  viral mutation(s) within the areas targeted by this assay, and inadequate number of viral copies(<138 copies/mL). A negative result must be combined with clinical observations, patient history, and epidemiological information. The expected result is Negative.  Fact Sheet for Patients:  EntrepreneurPulse.com.au  Fact Sheet for Healthcare Providers:  IncredibleEmployment.be  This test is no t yet approved or cleared by the [place] FDA and  has been authorized for detection and/or  diagnosis of SARS-CoV-2 by FDA under an Emergency Use Authorization (EUA). This EUA will remain  in effect (meaning this test can be used) for the duration of the COVID-19 declaration under Section 564(b)(1) of the Act, 21 U.S.C.section 360bbb-3(b)(1), unless the authorization is terminated  or revoked sooner.       Influenza A by PCR NEGATIVE NEGATIVE Final   Influenza B by PCR NEGATIVE NEGATIVE Final    Comment: (NOTE) The Xpert Xpress SARS-CoV-2/FLU/RSV plus assay is intended as an aid in the diagnosis of influenza from Nasopharyngeal swab specimens and should not be used as a sole basis for treatment. Nasal washings and aspirates are unacceptable for Xpert Xpress SARS-CoV-2/FLU/RSV testing.  Fact Sheet for Patients: EntrepreneurPulse.com.au  Fact Sheet for Healthcare Providers: IncredibleEmployment.be  This test is not yet approved or cleared by the [place] FDA and has been authorized for detection and/or diagnosis of SARS-CoV-2 by FDA under an Emergency Use Authorization (EUA). This EUA will remain in effect (meaning this test can be used) for the duration of the COVID-19 declaration under Section 564(b)(1) of the Act, 21 U.S.C. section 360bbb-3(b)(1), unless the authorization is terminated or revoked.  Performed at [facility], [place] [address] [place], [place] [address]   Urine Culture      Status: Abnormal (Preliminary result)   Collection Time: 12/20/20  9:23 AM   Specimen: Urine, Clean Catch  Result Value Ref Range Status   Specimen Description   Final    URINE, CLEAN CATCH Performed at [facility], [place] [address] [place], [place] [address]    Special Requests   Final    NONE Performed at [facility], [place] [address] [place], [place] [address]    Culture (A)  Final    >=100,000 COLONIES/mL ESCHERICHIA COLI SUSCEPTIBILITIES TO FOLLOW Performed at [facility], [place] [address], [place], [place] [address]    Report Status PENDING  Incomplete  Blood culture (routine x 2)     Status: Abnormal (Preliminary result)   Collection Time: 12/20/20 10:44 AM   Specimen: BLOOD  Result Value Ref Range Status   Specimen Description   Final    BLOOD LEFT ANTECUBITAL Performed at [place] [address] [place], [place] [address]    Special Requests   Final    BOTTLES DRAWN AEROBIC AND ANAEROBIC Blood Culture adequate volume Performed at [place] [address] [place], [place] [address]    Culture  Setup Time   Final    GRAM NEGATIVE RODS IN BOTH AEROBIC AND ANAEROBIC BOTTLES Organism ID to follow CRITICAL RESULT CALLED TO, READ BACK BY AND VERIFIED WITH: [place] Slider PHARMD, AT 0016 12/21/20 D. Victoriano Lain    Culture (A)  Final    ESCHERICHIA COLI SUSCEPTIBILITIES TO FOLLOW Performed at [facility], [place] [address] [place], [place] [address]    Report Status PENDING  Incomplete  Blood Culture ID Panel (Reflexed)     Status: Abnormal   Collection Time: 12/20/20 10:44 AM  Result Value Ref Range Status   Enterococcus faecalis NOT DETECTED NOT DETECTED Final   Enterococcus Faecium NOT DETECTED NOT DETECTED Final   Listeria monocytogenes NOT DETECTED NOT DETECTED Final   Staphylococcus species NOT DETECTED NOT DETECTED Final   Staphylococcus aureus (BCID) NOT DETECTED NOT  DETECTED Final   Staphylococcus epidermidis NOT DETECTED NOT DETECTED Final   Staphylococcus lugdunensis NOT DETECTED NOT DETECTED Final   Streptococcus species NOT  DETECTED NOT DETECTED Final   Streptococcus agalactiae NOT DETECTED NOT DETECTED Final   Streptococcus pneumoniae NOT DETECTED NOT DETECTED Final   Streptococcus pyogenes NOT DETECTED NOT DETECTED Final   A.calcoaceticus-baumannii NOT DETECTED NOT DETECTED Final   Bacteroides fragilis NOT DETECTED NOT DETECTED Final   Enterobacterales DETECTED (A) NOT DETECTED Final    Comment: Enterobacterales represent a large order of gram negative bacteria, not a single organism. CRITICAL RESULT CALLED TO, READ BACK BY AND VERIFIED WITH: [place] Slider PHARMD, AT 0016 12/21/20 D. VANHOOK    Enterobacter cloacae complex NOT DETECTED NOT DETECTED Final   Escherichia coli DETECTED (A) NOT DETECTED Final    Comment: CRITICAL RESULT CALLED TO, READ BACK BY AND VERIFIED WITH: [place] Slider PHARMD, AT 0016 12/21/20 D. VANHOOK    Klebsiella aerogenes NOT DETECTED NOT DETECTED Final   Klebsiella oxytoca NOT DETECTED NOT DETECTED Final   Klebsiella pneumoniae NOT DETECTED NOT DETECTED Final   Proteus species NOT DETECTED NOT DETECTED Final   Salmonella species NOT DETECTED NOT DETECTED Final   Serratia marcescens NOT DETECTED NOT DETECTED Final   Haemophilus influenzae NOT DETECTED NOT DETECTED Final   Neisseria meningitidis NOT DETECTED NOT DETECTED Final   Pseudomonas aeruginosa NOT DETECTED NOT DETECTED Final   Stenotrophomonas maltophilia NOT DETECTED NOT DETECTED Final   Candida albicans NOT DETECTED NOT DETECTED Final   Candida auris NOT DETECTED NOT DETECTED Final   Candida glabrata NOT DETECTED NOT DETECTED Final   Candida krusei NOT DETECTED NOT DETECTED Final   Candida parapsilosis NOT DETECTED NOT DETECTED Final   Candida tropicalis NOT DETECTED NOT DETECTED Final   Cryptococcus neoformans/gattii NOT DETECTED NOT DETECTED Final   CTX-M  ESBL NOT DETECTED NOT DETECTED Final   Carbapenem resistance IMP NOT DETECTED NOT DETECTED Final   Carbapenem resistance KPC NOT DETECTED NOT DETECTED Final   Carbapenem resistance NDM NOT DETECTED NOT DETECTED Final   Carbapenem resist OXA 48 LIKE NOT DETECTED NOT DETECTED Final   Carbapenem resistance VIM NOT DETECTED NOT DETECTED Final    Comment: Performed at [facility], [address] [place], [place] [address]  Blood culture (routine x 2)     Status: None (Preliminary result)   Collection Time: 12/20/20 10:45 AM   Specimen: BLOOD  Result Value Ref Range Status   Specimen Description   Final    BLOOD RIGHT ANTECUBITAL Performed at [place] [address] [place], [place] [address]    Special Requests   Final    BOTTLES DRAWN AEROBIC AND ANAEROBIC Blood Culture results may not be optimal due to an inadequate volume of blood received in culture bottles Performed at [place] [address] [place], [place] [address]    Culture  Setup Time   Final    GRAM NEGATIVE RODS IN BOTH AEROBIC AND ANAEROBIC BOTTLES    Culture   Final    GRAM NEGATIVE RODS IDENTIFICATION TO FOLLOW Performed at [facility], [place] [address] [place], [place] [address]    Report Status PENDING  Incomplete   Creatinine: Recent Labs    12/20/20 0744 12/21/20 0408  CREATININE 0.97 0.88   CT scan personally reviewed and is detailed in the history of present illness  Impression/Assessment:  Left-sided pyelonephritis with renal abscess  Plan:  Follow-up official radiology read.  I spoke with interventional radiology and no current intervention is recommended such as aspiration.  Recommend consideration of infectious disease consultation for type and  duration of antibiotics once cultures finalize.  Marton Redwood, III 12/21/2020, 6:40 PM

## 2020-12-21 NOTE — Progress Notes (Signed)
PHARMACY - PHYSICIAN COMMUNICATION CRITICAL VALUE ALERT - BLOOD CULTURE IDENTIFICATION (BCID)  Linda Chang is an 55 y.o. female who presented to [facility] on 12/20/2020 with a chief complaint of abdominal and flank pain on L side with fever, chillls, nausea and dysuria.   Assessment:  Admit with pyelonephritis and possible kidney abscess/sepsis.  2/2 blood cx now + GNR; BCID + Ecoliw ith no resistance.  Name of physician (or Provider) Contacted: Clarene Essex, NP  Current antibiotics: Rocephin 1gm IV q24h  Changes to prescribed antibiotics recommended:  Rocephin increased to 2gm IV q24h for bloodstream infection  Results for orders placed or performed during the hospital encounter of 12/20/20  Blood Culture ID Panel (Reflexed) (Collected: 12/20/2020 10:44 AM)  Result Value Ref Range   Enterococcus faecalis NOT DETECTED NOT DETECTED   Enterococcus Faecium NOT DETECTED NOT DETECTED   Listeria monocytogenes NOT DETECTED NOT DETECTED   Staphylococcus species NOT DETECTED NOT DETECTED   Staphylococcus aureus (BCID) NOT DETECTED NOT DETECTED   Staphylococcus epidermidis NOT DETECTED NOT DETECTED   Staphylococcus lugdunensis NOT DETECTED NOT DETECTED   Streptococcus species NOT DETECTED NOT DETECTED   Streptococcus agalactiae NOT DETECTED NOT DETECTED   Streptococcus pneumoniae NOT DETECTED NOT DETECTED   Streptococcus pyogenes NOT DETECTED NOT DETECTED   A.calcoaceticus-baumannii NOT DETECTED NOT DETECTED   Bacteroides fragilis NOT DETECTED NOT DETECTED   Enterobacterales DETECTED (A) NOT DETECTED   Enterobacter cloacae complex NOT DETECTED NOT DETECTED   Escherichia coli DETECTED (A) NOT DETECTED   Klebsiella aerogenes NOT DETECTED NOT DETECTED   Klebsiella oxytoca NOT DETECTED NOT DETECTED   Klebsiella pneumoniae NOT DETECTED NOT DETECTED   Proteus species NOT DETECTED NOT DETECTED   Salmonella species NOT DETECTED NOT DETECTED   Serratia marcescens NOT DETECTED NOT DETECTED    Haemophilus influenzae NOT DETECTED NOT DETECTED   Neisseria meningitidis NOT DETECTED NOT DETECTED   Pseudomonas aeruginosa NOT DETECTED NOT DETECTED   Stenotrophomonas maltophilia NOT DETECTED NOT DETECTED   Candida albicans NOT DETECTED NOT DETECTED   Candida auris NOT DETECTED NOT DETECTED   Candida glabrata NOT DETECTED NOT DETECTED   Candida krusei NOT DETECTED NOT DETECTED   Candida parapsilosis NOT DETECTED NOT DETECTED   Candida tropicalis NOT DETECTED NOT DETECTED   Cryptococcus neoformans/gattii NOT DETECTED NOT DETECTED   CTX-M ESBL NOT DETECTED NOT DETECTED   Carbapenem resistance IMP NOT DETECTED NOT DETECTED   Carbapenem resistance KPC NOT DETECTED NOT DETECTED   Carbapenem resistance NDM NOT DETECTED NOT DETECTED   Carbapenem resist OXA 48 LIKE NOT DETECTED NOT DETECTED   Carbapenem resistance VIM NOT DETECTED NOT DETECTED    Netta Cedars PharmD 12/21/2020  12:20 AM

## 2020-12-21 NOTE — Progress Notes (Signed)
[facility]                                                                              Patient Demographics  Linda Chang, is a 55 y.o. female, DOB - Feb 05, 1966, [id number]  Admit date - 12/20/2020   Admitting Physician Cherene Altes, MD  Outpatient Primary MD for the patient is Patient, No Pcp Per (Inactive)  Outpatient specialists:   LOS - 1  days   Medical records reviewed and are as summarized below:    Chief Complaint  Patient presents with   Flank Pain       Brief summary   Patient is a 55 year old female with COPD, ongoing tobacco use, chronic back pain, depression/anxiety presented to ED with 3 days of left-sided abdominal and flank pain, sharp, intermittent paroxysms of worsening pain.  Also reported fevers chills, nausea and vomiting, diarrhea and dysuria.  This resulted in intolerance to oral intake with extremely limited to almost no liquids or solids consumed for the 24+ hours. In ED, temp 100.6 F, heart rate 95, BP 155/90, leukocytosis WBCs 11.0, creatinine 0.97, UA positive for UTI Lactic acid normal 0.5 CT renal stone study showed small left ureteral stone, 2.3 cm low-density lesion in the mid to lower pole of the left kidney with surrounding perinephric stranding, possible pyelonephritis with small abscess.  Cholelithiasis.  Assessment & Plan    Principal Problem:  Acute left Pyelonephritis, UTI, E. coli bacteremia with possible renal abscess - Follow urine culture incentive disease, BC ID positive for E. Coli - Rocephin increased to 2 g IV daily - Continue fluids, pain control, antiemetics - d/w urology, Dr. Gloriann Loan, recommended CT abdomen pelvis with contrast for better characterization of the renal abscess and the stone, will evaluate patient.    Active Problems:     COPD (chronic obstructive pulmonary disease) (HCC) -Continue albuterol inhaler as needed, currently no wheezing    Nicotine dependence -Counseled on smoking  cessation, continue nicotine patch  Hypokalemia -Resolved    Code Status: full code  DVT Prophylaxis:  enoxaparin (LOVENOX) injection 40 mg Start: 12/20/20 1230   Level of Care: Level of care: Med-Surg Family Communication: Discussed all imaging results, lab results, explained to the patient    Disposition Plan:     Status is: Inpatient  Remains inpatient appropriate because:Inpatient level of care appropriate due to severity of illness  Dispo: The patient is from: Home              Anticipated d/c is to: Home              Patient currently is not medically stable to d/c.   Difficult to place patient No      Time Spent in minutes   73mns   Procedures:  CT renal stone study  Consultants:   Urology Dr. BGloriann Loan Antimicrobials:   Anti-infectives (From admission, onward)    Start     Dose/Rate Route Frequency Ordered Stop   12/21/20 1000  cefTRIAXone (ROCEPHIN) 1 g in sodium chloride 0.9 % 100 mL IVPB  Status:  Discontinued        1 g  200 mL/hr over 30 Minutes Intravenous Every 24 hours 12/20/20 1126 12/21/20 0017   12/21/20 0600  cefTRIAXone (ROCEPHIN) 2 g in sodium chloride 0.9 % 100 mL IVPB        2 g 200 mL/hr over 30 Minutes Intravenous Every 24 hours 12/21/20 0017 12/27/20 0559   12/20/20 1015  cefTRIAXone (ROCEPHIN) 1 g in sodium chloride 0.9 % 100 mL IVPB        1 g 200 mL/hr over 30 Minutes Intravenous  Once 12/20/20 1013 12/20/20 1116          Medications  Scheduled Meds:  enoxaparin (LOVENOX) injection  40 mg Subcutaneous Daily   nicotine  14 mg Transdermal Daily   Continuous Infusions:  0.9 % NaCl with KCl 20 mEq / L 125 mL/hr at 12/21/20 0141   cefTRIAXone (ROCEPHIN)  IV 2 g (12/21/20 0530)   PRN Meds:.acetaminophen **OR** [DISCONTINUED] acetaminophen, albuterol, ibuprofen, loperamide, ondansetron **OR** ondansetron (ZOFRAN) IV      Subjective:   Linda Chang was seen and examined today.  Still having left flank and back pain, 7/10.   No ongoing vomiting or diarrhea.  Afebrile this morning.  Patient denies dizziness, chest pain, shortness of breath,  new weakness, numbess, tingling. No acute events overnight.    Objective:   Vitals:   12/20/20 2008 12/20/20 2128 12/21/20 0148 12/21/20 0501  BP: 125/88 101/73 (!) 95/59 99/63  Pulse: (!) 102 95 87 89  Resp: '18 17 19 18  '$ Temp: 97.8 F (36.6 C) 99.8 F (37.7 C) 98.2 F (36.8 C) 99.1 F (37.3 C)  TempSrc: Oral Oral Oral Oral  SpO2: 100% 96% 97% 98%  Weight:      Height:        Intake/Output Summary (Last 24 hours) at 12/21/2020 1017 Last data filed at 12/21/2020 0600 Gross per 24 hour  Intake 1961.86 ml  Output 500 ml  Net 1461.86 ml     Wt Readings from Last 3 Encounters:  12/20/20 84.2 kg  02/28/20 90.7 kg  06/09/19 90.7 kg     Exam General: Alert and oriented x 3, NAD Cardiovascular: S1 S2 auscultated,  RRR Respiratory: Clear to auscultation bilaterally, no wheezing Gastrointestinal: Soft, non distended, + bowel sounds, left CVAT Ext: no pedal edema bilaterally Neuro: no acute FND's Musculoskeletal: No digital cyanosis, clubbing Skin: No rashes Psych: Normal affect and demeanor, alert and oriented x3    Data Reviewed:  I have personally reviewed following labs and imaging studies  Micro Results Recent Results (from the past 240 hour(s))  Resp Panel by RT-PCR (Flu A&B, Covid) Nasopharyngeal Swab     Status: None   Collection Time: 12/20/20  7:58 AM   Specimen: Nasopharyngeal Swab; Nasopharyngeal(NP) swabs in vial transport medium  Result Value Ref Range Status   SARS Coronavirus 2 by RT PCR NEGATIVE NEGATIVE Final    Comment: (NOTE) SARS-CoV-2 target nucleic acids are NOT DETECTED.  The SARS-CoV-2 RNA is generally detectable in upper respiratory specimens during the acute phase of infection. The lowest concentration of SARS-CoV-2 viral copies this assay can detect is 138 copies/mL. A negative result does not preclude SARS-Cov-2 infection  and should not be used as the sole basis for treatment or other patient management decisions. A negative result may occur with  improper specimen collection/handling, submission of specimen other than nasopharyngeal swab, presence of viral mutation(s) within the areas targeted by this assay, and inadequate number of viral copies(<138 copies/mL). A negative result must be combined with clinical observations,  patient history, and epidemiological information. The expected result is Negative.  Fact Sheet for Patients:  EntrepreneurPulse.com.au  Fact Sheet for Healthcare Providers:  IncredibleEmployment.be  This test is no t yet approved or cleared by the [place] FDA and  has been authorized for detection and/or diagnosis of SARS-CoV-2 by FDA under an Emergency Use Authorization (EUA). This EUA will remain  in effect (meaning this test can be used) for the duration of the COVID-19 declaration under Section 564(b)(1) of the Act, 21 U.S.C.section 360bbb-3(b)(1), unless the authorization is terminated  or revoked sooner.       Influenza A by PCR NEGATIVE NEGATIVE Final   Influenza B by PCR NEGATIVE NEGATIVE Final    Comment: (NOTE) The Xpert Xpress SARS-CoV-2/FLU/RSV plus assay is intended as an aid in the diagnosis of influenza from Nasopharyngeal swab specimens and should not be used as a sole basis for treatment. Nasal washings and aspirates are unacceptable for Xpert Xpress SARS-CoV-2/FLU/RSV testing.  Fact Sheet for Patients: EntrepreneurPulse.com.au  Fact Sheet for Healthcare Providers: IncredibleEmployment.be  This test is not yet approved or cleared by the [place] FDA and has been authorized for detection and/or diagnosis of SARS-CoV-2 by FDA under an Emergency Use Authorization (EUA). This EUA will remain in effect (meaning this test can be used) for the duration of the COVID-19 declaration  under Section 564(b)(1) of the Act, 21 U.S.C. section 360bbb-3(b)(1), unless the authorization is terminated or revoked.  Performed at [facility], [place] [address], [place], [place] [address]   Blood culture (routine x 2)     Status: Abnormal (Preliminary result)   Collection Time: 12/20/20 10:44 AM   Specimen: BLOOD  Result Value Ref Range Status   Specimen Description   Final    BLOOD LEFT ANTECUBITAL Performed at [place] [address] [place], [place] [address]    Special Requests   Final    BOTTLES DRAWN AEROBIC AND ANAEROBIC Blood Culture adequate volume Performed at [place] [address] [place], [place] [address]    Culture  Setup Time   Final    GRAM NEGATIVE RODS IN BOTH AEROBIC AND ANAEROBIC BOTTLES Organism ID to follow CRITICAL RESULT CALLED TO, READ BACK BY AND VERIFIED WITH: Sheffield Slider PHARMD, AT 0016 12/21/20 D. Victoriano Lain    Culture (A)  Final    ESCHERICHIA COLI SUSCEPTIBILITIES TO FOLLOW Performed at [facility], [place] [address], [place], [place] [address]    Report Status PENDING  Incomplete  Blood Culture ID Panel (Reflexed)     Status: Abnormal   Collection Time: 12/20/20 10:44 AM  Result Value Ref Range Status   Enterococcus faecalis NOT DETECTED NOT DETECTED Final   Enterococcus Faecium NOT DETECTED NOT DETECTED Final   Listeria monocytogenes NOT DETECTED NOT DETECTED Final   Staphylococcus species NOT DETECTED NOT DETECTED Final   Staphylococcus aureus (BCID) NOT DETECTED NOT DETECTED Final   Staphylococcus epidermidis NOT DETECTED NOT DETECTED Final   Staphylococcus lugdunensis NOT DETECTED NOT DETECTED Final   Streptococcus species NOT DETECTED NOT DETECTED Final   Streptococcus agalactiae NOT DETECTED NOT DETECTED Final   Streptococcus pneumoniae NOT DETECTED NOT DETECTED Final   Streptococcus pyogenes NOT DETECTED NOT DETECTED Final   A.calcoaceticus-baumannii NOT  DETECTED NOT DETECTED Final   Bacteroides fragilis NOT DETECTED NOT DETECTED Final   Enterobacterales DETECTED (A) NOT DETECTED Final    Comment: Enterobacterales represent a large order of gram negative bacteria, not a single  organism. CRITICAL RESULT CALLED TO, READ BACK BY AND VERIFIED WITH: Sheffield Slider PHARMD, AT 0016 12/21/20 D. VANHOOK    Enterobacter cloacae complex NOT DETECTED NOT DETECTED Final   Escherichia coli DETECTED (A) NOT DETECTED Final    Comment: CRITICAL RESULT CALLED TO, READ BACK BY AND VERIFIED WITH: Sheffield Slider PHARMD, AT 0016 12/21/20 D. VANHOOK    Klebsiella aerogenes NOT DETECTED NOT DETECTED Final   Klebsiella oxytoca NOT DETECTED NOT DETECTED Final   Klebsiella pneumoniae NOT DETECTED NOT DETECTED Final   Proteus species NOT DETECTED NOT DETECTED Final   Salmonella species NOT DETECTED NOT DETECTED Final   Serratia marcescens NOT DETECTED NOT DETECTED Final   Haemophilus influenzae NOT DETECTED NOT DETECTED Final   Neisseria meningitidis NOT DETECTED NOT DETECTED Final   Pseudomonas aeruginosa NOT DETECTED NOT DETECTED Final   Stenotrophomonas maltophilia NOT DETECTED NOT DETECTED Final   Candida albicans NOT DETECTED NOT DETECTED Final   Candida auris NOT DETECTED NOT DETECTED Final   Candida glabrata NOT DETECTED NOT DETECTED Final   Candida krusei NOT DETECTED NOT DETECTED Final   Candida parapsilosis NOT DETECTED NOT DETECTED Final   Candida tropicalis NOT DETECTED NOT DETECTED Final   Cryptococcus neoformans/gattii NOT DETECTED NOT DETECTED Final   CTX-M ESBL NOT DETECTED NOT DETECTED Final   Carbapenem resistance IMP NOT DETECTED NOT DETECTED Final   Carbapenem resistance KPC NOT DETECTED NOT DETECTED Final   Carbapenem resistance NDM NOT DETECTED NOT DETECTED Final   Carbapenem resist OXA 48 LIKE NOT DETECTED NOT DETECTED Final   Carbapenem resistance VIM NOT DETECTED NOT DETECTED Final    Comment: Performed at [facility], [address] [place], [place] [address]  Blood culture (routine x 2)     Status: None (Preliminary result)   Collection Time: 12/20/20 10:45 AM   Specimen: BLOOD  Result Value Ref Range Status   Specimen Description   Final    BLOOD RIGHT ANTECUBITAL Performed at [place] [address] [place], [place] [address]    Special Requests   Final    BOTTLES DRAWN AEROBIC AND ANAEROBIC Blood Culture results may not be optimal due to an inadequate volume of blood received in culture bottles Performed at [place] [address], [place], [place] [address]    Culture  Setup Time   Final    GRAM NEGATIVE RODS IN BOTH AEROBIC AND ANAEROBIC BOTTLES    Culture   Final    GRAM NEGATIVE RODS IDENTIFICATION TO FOLLOW Performed at [facility], [place] [address] [place], [place] [address]    Report Status PENDING  Incomplete    Radiology Reports CT RENAL STONE STUDY  Result Date: 12/20/2020 CLINICAL DATA:  Left flank pain EXAM: CT ABDOMEN AND PELVIS WITHOUT CONTRAST TECHNIQUE: Multidetector CT imaging of the abdomen and pelvis was performed following the standard protocol without IV contrast. COMPARISON:  None. FINDINGS: Lower chest: No acute abnormality Hepatobiliary: Subtle high-density material seen within the gallbladder could reflect stones or sludge. No focal hepatic abnormality. Pancreas: Calcifications in the pancreatic head and uncinate process likely reflect changes of chronic pancreatitis. There is pancreatic atrophy. Mild pancreatic ductal dilatation measures up to 9 mm in the pancreatic body. Spleen: No focal abnormality.  Normal size. Adrenals/Urinary Tract: There is mild perinephric stranding surrounding the left kidney, vertically the mid and lower poles where there is a 2.2 cm low-density area in the midpole of the left kidney. No overt  hydronephrosis. There is a calcification in the left side of the pelvis measuring 3 mm. It is difficult to follow the  distal left ureter due to its decompressed state. I favor this is a phlebolith although a distal left ureteral stone cannot be completely excluded. Adrenal glands and urinary bladder unremarkable. Stomach/Bowel: Normal appendix. Stomach, large and small bowel grossly unremarkable. Vascular/Lymphatic: Aortic atherosclerosis. No evidence of aneurysm or adenopathy. Reproductive: Uterus and adnexa unremarkable.  No mass. Other: No free fluid or free air. Musculoskeletal: No acute bony abnormality. Bilateral hip replacements. IMPRESSION: Calcification in the left side of the pelvis which could reflect small left ureteral stone or phlebolith. The distal ureter is difficult to follow. Favor phlebolith. 2.3 cm low-density lesion in the mid to lower pole of the left kidney with surrounding perinephric stranding. Cannot exclude infection such as pyelonephritis with small abscess. Recommend clinical correlation for signs of urinary tract infection. Calcifications in the pancreatic head. Pancreatic atrophy and ductal dilatation. This likely is related to chronic pancreatitis but could be further evaluated with pancreatic protocol MRI. High-density material layering within the gallbladder could reflect small stones or sludge. Electronically Signed   By: Rolm Baptise M.D.   On: 12/20/2020 09:11    Lab Data:  CBC: Recent Labs  Lab 12/20/20 0744 12/21/20 0408  WBC 11.0* 14.7*  NEUTROABS 9.7*  --   HGB 12.8 11.4*  HCT 39.2 35.4*  MCV 90.5 92.7  PLT 214 [id number]   Basic Metabolic Panel: Recent Labs  Lab 12/20/20 0744 12/21/20 0408  NA 139 140  K 3.4* 4.0  CL 103 112*  CO2 25 23  GLUCOSE 173* 101*  BUN 10 15  CREATININE 0.97 0.88  CALCIUM 9.2 7.9*  MG  --  1.9   GFR: Estimated Creatinine Clearance: 82.9 mL/min (by C-G formula based on SCr of 0.88 mg/dL). Liver Function Tests: Recent Labs  Lab 12/20/20 0744 12/21/20 0408  AST 14* 16  ALT 15 17  ALKPHOS 85 95  BILITOT 0.4 0.5  PROT 6.8 5.3*  ALBUMIN  3.5 2.6*   Recent Labs  Lab 12/21/20 0408  LIPASE 19   No results for input(s): AMMONIA in the last 168 hours. Coagulation Profile: No results for input(s): INR, PROTIME in the last 168 hours. Cardiac Enzymes: No results for input(s): CKTOTAL, CKMB, CKMBINDEX, TROPONINI in the last 168 hours. BNP (last 3 results) No results for input(s): PROBNP in the last 8760 hours. HbA1C: Recent Labs    12/21/20 0408  HGBA1C 6.0*   CBG: No results for input(s): GLUCAP in the last 168 hours. Lipid Profile: No results for input(s): CHOL, HDL, LDLCALC, TRIG, CHOLHDL, LDLDIRECT in the last 72 hours. Thyroid Function Tests: No results for input(s): TSH, T4TOTAL, FREET4, T3FREE, THYROIDAB in the last 72 hours. Anemia Panel: No results for input(s): VITAMINB12, FOLATE, FERRITIN, TIBC, IRON, RETICCTPCT in the last 72 hours. Urine analysis:    Component Value Date/Time   COLORURINE YELLOW 12/20/2020 0933   APPEARANCEUR CLOUDY (A) 12/20/2020 0933   LABSPEC 1.009 12/20/2020 0933   PHURINE 7.0 12/20/2020 0933   GLUCOSEU NEGATIVE 12/20/2020 0933   HGBUR MODERATE (A) 12/20/2020 0933   BILIRUBINUR NEGATIVE 12/20/2020 0933   BILIRUBINUR 1+ 10/27/2014 0927   KETONESUR NEGATIVE 12/20/2020 0933   PROTEINUR 30 (A) 12/20/2020 0933   UROBILINOGEN 1.0 10/27/2014 0927   NITRITE POSITIVE (A) 12/20/2020 0933   LEUKOCYTESUR LARGE (A) 12/20/2020 0933     Nizhoni Parlow M.D. [facility] 12/21/2020, 10:17 AM  Available via  Epic secure chat 7am-7pm After 7 pm, please refer to night coverage provider listed on amion.

## 2020-12-22 ENCOUNTER — Inpatient Hospital Stay (HOSPITAL_COMMUNITY): Payer: Self-pay

## 2020-12-22 DIAGNOSIS — F17200 Nicotine dependence, unspecified, uncomplicated: Secondary | ICD-10-CM

## 2020-12-22 DIAGNOSIS — Q453 Other congenital malformations of pancreas and pancreatic duct: Secondary | ICD-10-CM

## 2020-12-22 DIAGNOSIS — E663 Overweight: Secondary | ICD-10-CM

## 2020-12-22 LAB — CBC
HCT: 32.1 % — ABNORMAL LOW (ref 36.0–46.0)
Hemoglobin: 10.4 g/dL — ABNORMAL LOW (ref 12.0–15.0)
MCH: 29.5 pg (ref 26.0–34.0)
MCHC: 32.4 g/dL (ref 30.0–36.0)
MCV: 91.2 fL (ref 80.0–100.0)
Platelets: 160 10*3/uL (ref 150–400)
RBC: 3.52 MIL/uL — ABNORMAL LOW (ref 3.87–5.11)
RDW: 13.6 % (ref 11.5–15.5)
WBC: 10 10*3/uL (ref 4.0–10.5)
nRBC: 0 % (ref 0.0–0.2)

## 2020-12-22 LAB — CULTURE, BLOOD (ROUTINE X 2): Special Requests: ADEQUATE

## 2020-12-22 LAB — BASIC METABOLIC PANEL
Anion gap: 6 (ref 5–15)
BUN: 15 mg/dL (ref 6–20)
CO2: 24 mmol/L (ref 22–32)
Calcium: 8.4 mg/dL — ABNORMAL LOW (ref 8.9–10.3)
Chloride: 112 mmol/L — ABNORMAL HIGH (ref 98–111)
Creatinine, Ser: 0.65 mg/dL (ref 0.44–1.00)
GFR, Estimated: >60 mL/min (ref >60)
Glucose, Bld: 91 mg/dL (ref 70–99)
Potassium: 3.4 mmol/L — ABNORMAL LOW (ref 3.5–5.1)
Sodium: 142 mmol/L (ref 135–145)

## 2020-12-22 LAB — URINE CULTURE: Culture: >=100000 — AB

## 2020-12-22 MED ORDER — GADOBUTROL 1 MMOL/ML IV SOLN
8.5000 mL | Freq: Once | INTRAVENOUS | Status: AC | PRN
  Administered 2020-12-22: 8.5 mL via INTRAVENOUS

## 2020-12-22 MED ORDER — LACTATED RINGERS IV SOLN: INTRAVENOUS | Status: DC

## 2020-12-22 NOTE — Progress Notes (Signed)
PROGRESS NOTE  Linda Chang CNO:[id number] DOB: 02-26-1966 DOA: 12/20/2020 PCP: Patient, No Pcp Per (Inactive)  HPI/Recap of past 7 hours: 55 year old female with past medical history of COPD and tobacco use admitted on 8/1 for sepsis secondary to pyelonephritis.  Blood cultures positive for E. coli.  Initial CT scan of abdomen and pelvis without contrast noted 2.3 cm low-density lesion in left kidney concerning for small abscess.  Urology consulted and patient underwent CT scan with contrast with focal renal abscess.  Urology discussed with interventional radiology and there does not appear to be fluid to drain.  Recommendation is to continue IV antibiotics.  Patient doing okay with some mild back pain although improved from previous day.  Assessment/Plan: Principal Problem: E. coli sepsis from pyelonephritis: Patient met criteria for sepsis on admission with heart rate greater than 90 and temp >100.5.  Continue IV Zosyn.  White blood cell count has since normalized.  Active Problems:   COPD (chronic obstructive pulmonary disease) ([place]): Stable, on room air.    Nicotine dependence: Continue nicotine patch.    Overweight (BMI 25.0-29.9): Patient meets criteria BMI greater than 25.  Abnormal pancreas findings: Both CTs noted pancreatic atrophy and ductal dilatation with calcifications in the pancreatic head, likely related to chronic pancreatitis but will discuss with patient about getting pancreatic protocol MRI.  She initially stated she was unsure if she could get an MRI due to previous hip arthroplasty.  Reviewed records from 2013 and appears to be okay.  Code Status: Full code  Family Communication: Declined for me to call family  Disposition Plan: Potential discharge in the next few days once confirmed antibiotics are enough to treat abscess   Consultants: Urology  Procedures: None  Antimicrobials: IV Rocephin 8/1-present  DVT prophylaxis: Lovenox  Level of care:  Med-Surg   Objective: Vitals:   12/21/20 2211 12/22/20 0556  BP: 131/71 (!) 141/76  Pulse: 90 87  Resp: 18 18  Temp: 98.1 F (36.7 C) 97.8 F (36.6 C)  SpO2: 99% 100%    Intake/Output Summary (Last 24 hours) at 12/22/2020 0851 Last data filed at 12/22/2020 0600 Gross per 24 hour  Intake 2652.4 ml  Output 1850 ml  Net 802.4 ml   Filed Weights   12/20/20 1954  Weight: 84.2 kg   Body mass index is 28.87 kg/m.  Exam:  General: Alert and oriented x3, no acute distress HEENT: Normocephalic and atraumatic, mucous membranes are moist Cardiovascular: Regular rate and rhythm, S1-S2 Respiratory: Clear to auscultation bilaterally Abdomen: Soft, nontender, nondistended, positive bowel sounds Musculoskeletal: No clubbing or cyanosis or edema Skin: No skin breaks, tears or lesions Psychiatry: Appropriate, no evidence of psychoses Neurology: No focal deficits   Data Reviewed: CBC: Recent Labs  Lab 12/20/20 0744 12/21/20 0408 12/22/20 0423  WBC 11.0* 14.7* 10.0  NEUTROABS 9.7*  --   --   HGB 12.8 11.4* 10.4*  HCT 39.2 35.4* 32.1*  MCV 90.5 92.7 91.2  PLT 214 167 294   Basic Metabolic Panel: Recent Labs  Lab 12/20/20 0744 12/21/20 0408 12/22/20 0423  NA 139 140 142  K 3.4* 4.0 3.4*  CL 103 112* 112*  CO2 25 23 24   GLUCOSE 173* 101* 91  BUN 10 15 15   CREATININE 0.97 0.88 0.65  CALCIUM 9.2 7.9* 8.4*  MG  --  1.9  --    GFR: Estimated Creatinine Clearance: 91.2 mL/min (by C-G formula based on SCr of 0.65 mg/dL). Liver Function Tests: Recent Labs  Lab 12/20/20 [phone]  12/21/20 0408  AST 14* 16  ALT 15 17  ALKPHOS 85 95  BILITOT 0.4 0.5  PROT 6.8 5.3*  ALBUMIN 3.5 2.6*   Recent Labs  Lab 12/21/20 0408  LIPASE 19   No results for input(s): AMMONIA in the last 168 hours. Coagulation Profile: No results for input(s): INR, PROTIME in the last 168 hours. Cardiac Enzymes: No results for input(s): CKTOTAL, CKMB, CKMBINDEX, TROPONINI in the last 168 hours. BNP  (last 3 results) No results for input(s): PROBNP in the last 8760 hours. HbA1C: Recent Labs    12/21/20 0408  HGBA1C 6.0*   CBG: No results for input(s): GLUCAP in the last 168 hours. Lipid Profile: No results for input(s): CHOL, HDL, LDLCALC, TRIG, CHOLHDL, LDLDIRECT in the last 72 hours. Thyroid Function Tests: No results for input(s): TSH, T4TOTAL, FREET4, T3FREE, THYROIDAB in the last 72 hours. Anemia Panel: No results for input(s): VITAMINB12, FOLATE, FERRITIN, TIBC, IRON, RETICCTPCT in the last 72 hours. Urine analysis:    Component Value Date/Time   COLORURINE YELLOW 12/20/2020 0933   APPEARANCEUR CLOUDY (A) 12/20/2020 0933   LABSPEC 1.009 12/20/2020 0933   PHURINE 7.0 12/20/2020 0933   GLUCOSEU NEGATIVE 12/20/2020 0933   HGBUR MODERATE (A) 12/20/2020 0933   BILIRUBINUR NEGATIVE 12/20/2020 0933   BILIRUBINUR 1+ 10/27/2014 0927   KETONESUR NEGATIVE 12/20/2020 0933   PROTEINUR 30 (A) 12/20/2020 0933   UROBILINOGEN 1.0 10/27/2014 0927   NITRITE POSITIVE (A) 12/20/2020 0933   LEUKOCYTESUR LARGE (A) 12/20/2020 0933   Sepsis Labs: @LABRCNTIP (procalcitonin:4,lacticidven:4)  ) Recent Results (from the past 240 hour(s))  Resp Panel by RT-PCR (Flu A&B, Covid) Nasopharyngeal Swab     Status: None   Collection Time: 12/20/20  7:58 AM   Specimen: Nasopharyngeal Swab; Nasopharyngeal(NP) swabs in vial transport medium  Result Value Ref Range Status   SARS Coronavirus 2 by RT PCR NEGATIVE NEGATIVE Final    Comment: (NOTE) SARS-CoV-2 target nucleic acids are NOT DETECTED.  The SARS-CoV-2 RNA is generally detectable in upper respiratory specimens during the acute phase of infection. The lowest concentration of SARS-CoV-2 viral copies this assay can detect is 138 copies/mL. A negative result does not preclude SARS-Cov-2 infection and should not be used as the sole basis for treatment or other patient management decisions. A negative result may occur with  improper specimen  collection/handling, submission of specimen other than nasopharyngeal swab, presence of viral mutation(s) within the areas targeted by this assay, and inadequate number of viral copies(<138 copies/mL). A negative result must be combined with clinical observations, patient history, and epidemiological information. The expected result is Negative.  Fact Sheet for Patients:  EntrepreneurPulse.com.au  Fact Sheet for Healthcare Providers:  IncredibleEmployment.be  This test is no t yet approved or cleared by the [place] FDA and  has been authorized for detection and/or diagnosis of SARS-CoV-2 by FDA under an Emergency Use Authorization (EUA). This EUA will remain  in effect (meaning this test can be used) for the duration of the COVID-19 declaration under Section 564(b)(1) of the Act, 21 U.S.C.section 360bbb-3(b)(1), unless the authorization is terminated  or revoked sooner.       Influenza A by PCR NEGATIVE NEGATIVE Final   Influenza B by PCR NEGATIVE NEGATIVE Final    Comment: (NOTE) The Xpert Xpress SARS-CoV-2/FLU/RSV plus assay is intended as an aid in the diagnosis of influenza from Nasopharyngeal swab specimens and should not be used as a sole basis for treatment. Nasal washings and aspirates are unacceptable for Xpert Xpress  SARS-CoV-2/FLU/RSV testing.  Fact Sheet for Patients: EntrepreneurPulse.com.au  Fact Sheet for Healthcare Providers: IncredibleEmployment.be  This test is not yet approved or cleared by the [place] FDA and has been authorized for detection and/or diagnosis of SARS-CoV-2 by FDA under an Emergency Use Authorization (EUA). This EUA will remain in effect (meaning this test can be used) for the duration of the COVID-19 declaration under Section 564(b)(1) of the Act, 21 U.S.C. section 360bbb-3(b)(1), unless the authorization is terminated or revoked.  Performed at [facility], [place] [address] [place], [place] [address]   Urine Culture     Status: Abnormal   Collection Time: 12/20/20  9:23 AM   Specimen: Urine, Clean Catch  Result Value Ref Range Status   Specimen Description   Final    URINE, CLEAN CATCH Performed at [facility], [place] [address] [place], [place] [address]    Special Requests   Final    NONE Performed at [facility], [place] [address], [place], [place] [address]    Culture >=100,000 COLONIES/mL ESCHERICHIA COLI (A)  Final   Report Status 12/22/2020 FINAL  Final   Organism ID, Bacteria ESCHERICHIA COLI (A)  Final      Susceptibility   Escherichia coli - MIC*    AMPICILLIN <=2 SENSITIVE Sensitive     CEFAZOLIN <=4 SENSITIVE Sensitive     CEFEPIME <=0.12 SENSITIVE Sensitive     CEFTRIAXONE <=0.25 SENSITIVE Sensitive     CIPROFLOXACIN <=0.25 SENSITIVE Sensitive     GENTAMICIN <=1 SENSITIVE Sensitive     IMIPENEM <=0.25 SENSITIVE Sensitive     NITROFURANTOIN <=16 SENSITIVE Sensitive     TRIMETH/SULFA <=20 SENSITIVE Sensitive     AMPICILLIN/SULBACTAM <=2 SENSITIVE Sensitive     PIP/TAZO <=4 SENSITIVE Sensitive     * >=100,000 COLONIES/mL ESCHERICHIA COLI  Blood culture (routine x 2)     Status: Abnormal   Collection Time: 12/20/20 10:44 AM   Specimen: BLOOD  Result Value Ref Range Status   Specimen Description   Final    BLOOD LEFT ANTECUBITAL Performed at [place] [address] [place], [place] [address]    Special Requests   Final    BOTTLES DRAWN AEROBIC AND ANAEROBIC Blood Culture adequate volume Performed at [place] [address], [place], [place] [address]    Culture  Setup Time   Final    GRAM NEGATIVE RODS IN BOTH AEROBIC AND ANAEROBIC BOTTLES Organism ID to follow CRITICAL RESULT CALLED TO, READ BACK BY AND VERIFIED WITH: Irwin Brakeman, AT 2119 12/21/20 [place] Performed at [facility],  [place] [address] [place], [place] [address]    Culture ESCHERICHIA COLI (A)  Final   Report Status 12/22/2020 FINAL  Final   Organism ID, Bacteria ESCHERICHIA COLI  Final      Susceptibility   Escherichia coli - MIC*    AMPICILLIN <=2 SENSITIVE Sensitive     CEFAZOLIN <=4 SENSITIVE Sensitive     CEFEPIME <=0.12 SENSITIVE Sensitive     CEFTAZIDIME <=1 SENSITIVE Sensitive     CEFTRIAXONE <=0.25 SENSITIVE Sensitive     CIPROFLOXACIN <=0.25 SENSITIVE Sensitive     GENTAMICIN <=1 SENSITIVE Sensitive     IMIPENEM <=0.25 SENSITIVE Sensitive     TRIMETH/SULFA <=20 SENSITIVE Sensitive     AMPICILLIN/SULBACTAM <=2 SENSITIVE Sensitive     PIP/TAZO <=4 SENSITIVE Sensitive     * ESCHERICHIA COLI  Blood Culture ID Panel (Reflexed)  Status: Abnormal   Collection Time: 12/20/20 10:44 AM  Result Value Ref Range Status   Enterococcus faecalis NOT DETECTED NOT DETECTED Final   Enterococcus Faecium NOT DETECTED NOT DETECTED Final   Listeria monocytogenes NOT DETECTED NOT DETECTED Final   Staphylococcus species NOT DETECTED NOT DETECTED Final   Staphylococcus aureus (BCID) NOT DETECTED NOT DETECTED Final   Staphylococcus epidermidis NOT DETECTED NOT DETECTED Final   Staphylococcus lugdunensis NOT DETECTED NOT DETECTED Final   Streptococcus species NOT DETECTED NOT DETECTED Final   Streptococcus agalactiae NOT DETECTED NOT DETECTED Final   Streptococcus pneumoniae NOT DETECTED NOT DETECTED Final   Streptococcus pyogenes NOT DETECTED NOT DETECTED Final   A.calcoaceticus-baumannii NOT DETECTED NOT DETECTED Final   Bacteroides fragilis NOT DETECTED NOT DETECTED Final   Enterobacterales DETECTED (A) NOT DETECTED Final    Comment: Enterobacterales represent a large order of gram negative bacteria, not a single organism. CRITICAL RESULT CALLED TO, READ BACK BY AND VERIFIED WITH: Sheffield Slider PHARMD, AT 0016 12/21/20 D. VANHOOK    Enterobacter cloacae complex NOT DETECTED NOT DETECTED Final   Escherichia coli  DETECTED (A) NOT DETECTED Final    Comment: CRITICAL RESULT CALLED TO, READ BACK BY AND VERIFIED WITH: Sheffield Slider PHARMD, AT 0016 12/21/20 D. VANHOOK    Klebsiella aerogenes NOT DETECTED NOT DETECTED Final   Klebsiella oxytoca NOT DETECTED NOT DETECTED Final   Klebsiella pneumoniae NOT DETECTED NOT DETECTED Final   Proteus species NOT DETECTED NOT DETECTED Final   Salmonella species NOT DETECTED NOT DETECTED Final   Serratia marcescens NOT DETECTED NOT DETECTED Final   Haemophilus influenzae NOT DETECTED NOT DETECTED Final   Neisseria meningitidis NOT DETECTED NOT DETECTED Final   Pseudomonas aeruginosa NOT DETECTED NOT DETECTED Final   Stenotrophomonas maltophilia NOT DETECTED NOT DETECTED Final   Candida albicans NOT DETECTED NOT DETECTED Final   Candida auris NOT DETECTED NOT DETECTED Final   Candida glabrata NOT DETECTED NOT DETECTED Final   Candida krusei NOT DETECTED NOT DETECTED Final   Candida parapsilosis NOT DETECTED NOT DETECTED Final   Candida tropicalis NOT DETECTED NOT DETECTED Final   Cryptococcus neoformans/gattii NOT DETECTED NOT DETECTED Final   CTX-M ESBL NOT DETECTED NOT DETECTED Final   Carbapenem resistance IMP NOT DETECTED NOT DETECTED Final   Carbapenem resistance KPC NOT DETECTED NOT DETECTED Final   Carbapenem resistance NDM NOT DETECTED NOT DETECTED Final   Carbapenem resist OXA 48 LIKE NOT DETECTED NOT DETECTED Final   Carbapenem resistance VIM NOT DETECTED NOT DETECTED Final    Comment: Performed at [facility], [address] [place], [place] [address]  Blood culture (routine x 2)     Status: Abnormal   Collection Time: 12/20/20 10:45 AM   Specimen: BLOOD  Result Value Ref Range Status   Specimen Description   Final    BLOOD RIGHT ANTECUBITAL Performed at [place] [address] [place], [place] [address]    Special Requests   Final    BOTTLES DRAWN AEROBIC AND ANAEROBIC Blood Culture results may not be optimal due to  an inadequate volume of blood received in culture bottles Performed at [place] [address] [place],  [address]    Culture  Setup Time   Final    GRAM NEGATIVE RODS IN BOTH AEROBIC AND ANAEROBIC BOTTLES    Culture (A)  Final    ESCHERICHIA COLI SUSCEPTIBILITIES PERFORMED ON PREVIOUS CULTURE WITHIN THE LAST 5 DAYS. Performed at [facility], [place] [address] [place], [place] [address]    Report Status 12/22/2020 FINAL  Final  MRSA Next Gen by PCR, Nasal     Status: Abnormal   Collection Time: 12/21/20  7:39 PM   Specimen: Nasal Mucosa; Nasal Swab  Result Value Ref Range Status   MRSA by PCR Next Gen DETECTED (A) NOT DETECTED Final    Comment: RESULT CALLED TO, READ BACK BY AND VERIFIED WITH: PEREZ,A RN @2157  ON 12/21/20 JACKSON,K (NOTE) The GeneXpert MRSA Assay (FDA approved for NASAL specimens only), is one component of a comprehensive MRSA colonization surveillance program. It is not intended to diagnose MRSA infection nor to guide or monitor treatment for MRSA infections. Test performance is not FDA approved in patients less than 54 years old. Performed at [facility], [place] [address] [place], [place] [address]       Studies: CT ABDOMEN PELVIS W CONTRAST  Result Date: 12/21/2020 CLINICAL DATA:  Abdominal abscess/infection suspected. EXAM: CT ABDOMEN AND PELVIS WITH CONTRAST TECHNIQUE: Multidetector CT imaging of the abdomen and pelvis was performed using the standard protocol following bolus administration of intravenous contrast. CONTRAST:  34m OMNIPAQUE IOHEXOL 350 MG/ML SOLN COMPARISON:  CT yesterday. FINDINGS: Lower chest: Minimal dependent atelectasis at the right lung base. Hepatobiliary: Normal Pancreas: Chronic pancreatic calcifications in chronic ductal prominence. No evidence of focal mass. Spleen: Normal Adrenals/Urinary Tract: Adrenal glands are normal. Right kidney is normal. Mild fullness of the right renal  collecting system and ureter but no obstructing abnormality seen. Left kidney again shows what likely represents focal lobar nephronia with the renal abscess measuring about 3 cm in size. Adjacent edema in the Peri renal space. There does not appear to be a ureteral stone. No stone in either kidney. Stomach/Bowel: Stomach and small intestine are normal. No abnormal colon finding. Vascular/Lymphatic: Aortic atherosclerosis. No aneurysm. IVC is normal. No adenopathy. Reproductive: No pelvic mass. Musculoskeletal: Previous bilateral hip replacements. No spinal abnormality. IMPRESSION: Slight enlargement of the left renal process since yesterday, now measuring up to 3 cm. 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 Adjacent stranding in the Peri renal space. I do not think there is an obstructing ureteral lesion. Redemonstration of pancreatic calcification and ductal dilatation. As recommended yesterday, pancreatic MRI could be considered. Electronically Signed   By: MNelson ChimesM.D.   On: 12/21/2020 18:29    Scheduled Meds:  Chlorhexidine Gluconate Cloth  6 each Topical Q0600   enoxaparin (LOVENOX) injection  40 mg Subcutaneous Daily   mupirocin ointment  1 application Nasal BID   nicotine  14 mg Transdermal Daily    Continuous Infusions:  cefTRIAXone (ROCEPHIN)  IV 2 g (12/22/20 02263   lactated ringers 100 mL/hr at 12/21/20 2313     LOS: 2 days     SAnnita Brod MD [facility]   12/22/2020, 8:51 AM

## 2020-12-22 NOTE — Progress Notes (Signed)
Pt verbalized concern with her MRI orders during shift change. Pt states she had hip replacement surgery in 2013 and was told not to get MRI's. Pt states she told this to her doctor this AM and he said he would look into it. Per note from Dr Maryland Pink, he "reviewed records from 2013 and appears to be okay." This writer called MRI to verify this and MRI tech states its still a go.

## 2020-12-23 DIAGNOSIS — K8689 Other specified diseases of pancreas: Secondary | ICD-10-CM

## 2020-12-23 DIAGNOSIS — A4151 Sepsis due to Escherichia coli [E. coli]: Principal | ICD-10-CM

## 2020-12-23 DIAGNOSIS — N151 Renal and perinephric abscess: Secondary | ICD-10-CM

## 2020-12-23 DIAGNOSIS — N12 Tubulo-interstitial nephritis, not specified as acute or chronic: Secondary | ICD-10-CM

## 2020-12-23 LAB — BASIC METABOLIC PANEL
Anion gap: 7 (ref 5–15)
BUN: 11 mg/dL (ref 6–20)
CO2: 27 mmol/L (ref 22–32)
Calcium: 8.2 mg/dL — ABNORMAL LOW (ref 8.9–10.3)
Chloride: 109 mmol/L (ref 98–111)
Creatinine, Ser: 0.61 mg/dL (ref 0.44–1.00)
GFR, Estimated: >60 mL/min (ref >60)
Glucose, Bld: 91 mg/dL (ref 70–99)
Potassium: 3.1 mmol/L — ABNORMAL LOW (ref 3.5–5.1)
Sodium: 143 mmol/L (ref 135–145)

## 2020-12-23 LAB — SEDIMENTATION RATE: Sed Rate: 68 mm/hr — ABNORMAL HIGH (ref 0–22)

## 2020-12-23 LAB — CBC
HCT: 33 % — ABNORMAL LOW (ref 36.0–46.0)
Hemoglobin: 10.8 g/dL — ABNORMAL LOW (ref 12.0–15.0)
MCH: 29.3 pg (ref 26.0–34.0)
MCHC: 32.7 g/dL (ref 30.0–36.0)
MCV: 89.4 fL (ref 80.0–100.0)
Platelets: 164 10*3/uL (ref 150–400)
RBC: 3.69 MIL/uL — ABNORMAL LOW (ref 3.87–5.11)
RDW: 13.3 % (ref 11.5–15.5)
WBC: 7.5 10*3/uL (ref 4.0–10.5)
nRBC: 0 % (ref 0.0–0.2)

## 2020-12-23 LAB — C-REACTIVE PROTEIN: CRP: 8 mg/dL — ABNORMAL HIGH (ref <1.0)

## 2020-12-23 MED ORDER — PANCRELIPASE (LIP-PROT-AMYL) 36000-114000 UNITS PO CPEP
72000.0000 [IU] | ORAL_CAPSULE | Freq: Three times a day (TID) | ORAL | Status: DC
  Administered 2020-12-23: 72000 [IU] via ORAL
  Filled 2020-12-23: qty 2

## 2020-12-23 MED ORDER — PANCRELIPASE (LIP-PROT-AMYL) 36000-114000 UNITS PO CPEP: 72000.0000 [IU] | ORAL_CAPSULE | Freq: Three times a day (TID) | ORAL | 1 refills | Status: AC

## 2020-12-23 MED ORDER — CEFDINIR 300 MG PO CAPS: 300.0000 mg | ORAL_CAPSULE | Freq: Two times a day (BID) | ORAL | 0 refills | Status: AC

## 2020-12-23 MED ORDER — ALBUTEROL SULFATE HFA 108 (90 BASE) MCG/ACT IN AERS: 1.0000 | INHALATION_SPRAY | Freq: Four times a day (QID) | RESPIRATORY_TRACT | 1 refills | Status: AC | PRN

## 2020-12-23 NOTE — Progress Notes (Signed)
Assessment unchanged. Pt verbalized understanding of dc instructions through teach back. Discharged via foot per request to front entrance to meet [employer] accompanied by RN.

## 2020-12-23 NOTE — TOC Transition Note (Signed)
Transition of Care [facility]) - CM/SW Discharge Note   Patient Details  Name: Onedia Niemeier MRN: [id number] Date of Birth: [birth year]  Transition of Care [facility]) CM/SW Contact:  Lennart Pall, LCSW Phone Number: 12/23/2020, 3:56 PM   Clinical Narrative:    Alerted by pharmacy that pt will be dc'd on 4 weeks of abx coverage and requesting assistance for cost for patient.  Have reviewed with pt that she is uninsured and she does not have a PCP.  She is agreeable with my assistance to place into the [facility] program and refer for PCP. I have referred her for new patient appointment at Primary [facility] at [facility] - 8/23 at 11am.   She has been provided with handout re: [place] (letter) and clinic contact info.  No further TOC needs at this time.   Final next level of care: Home/Self Care Barriers to Discharge: No Barriers Identified   Patient Goals and CMS Choice Patient states their goals for this hospitalization and ongoing recovery are:: hopes to dc today      Discharge Placement                       Discharge Plan and Services                DME Arranged: N/A DME Agency: NA                  Social Determinants of Health (SDOH) Interventions     Readmission Risk Interventions No flowsheet data found.

## 2020-12-23 NOTE — Consult Note (Signed)
Reason for Consult: Pancreatic duct stone Referring Physician: [facility]  Linda Chang HPI: This is a 55 year old female with a PMH of COPD, s/p bilateral hip replacements, asthma, tobacco use, and history of alcohol use admitted for a left renal nephronia.  She reported having 3 days of left sided flank pain that was associated with fevers, chills, diarrhea, dysuria, nausea, and vomiting.  As a result of her symptoms her PO intake markedly declined.  In the ER her temperature was documented at 100.6.  A CT scan of the abdomen showed a 2.3 cm left renal lobar nephronia.  She was also positive for sepsis from E. Coli.  The abdominal CT scan showed an incidental pancreatic calcifications and pancreatic body atrophy.  Further work up with an MRI showed a 9 mm main pancreatic ductal stone and upstream dilation of the PD to 10 mm.  She smokes and she has a history of ETOH use.  There is a 40 pack year history of smoking and she drank for 30 years.  She only stopped drinking 2 years ago and in the past she was charged with a DUI.  Past Medical History:  Diagnosis Date   Anxiety    Arthritis    Asthma    Chronic back pain    Chronic neck pain    COPD (chronic obstructive pulmonary disease) ([place])    Depression    prior hospitalization    Finger joint swelling    Morning joint stiffness    Polyarthralgia    S/P bilateral hip replacements 2004, 2003   Tobacco use     Past Surgical History:  Procedure Laterality Date   TONSILLECTOMY     TOTAL HIP ARTHROPLASTY Bilateral     History reviewed. No pertinent family history.  Social History:  reports that she has been smoking cigarettes. She has a 7.50 pack-year smoking history. She has never used smokeless tobacco. She reports that she does not drink alcohol and does not use drugs.  Allergies:  Allergies  Allergen Reactions   Morphine And Related Other (See Comments)    "makes me feel bad"    Medications: Scheduled:  Chlorhexidine  Gluconate Cloth  6 each Topical Q0600   enoxaparin (LOVENOX) injection  40 mg Subcutaneous Daily   mupirocin ointment  1 application Nasal BID   nicotine  14 mg Transdermal Daily   Continuous:  cefTRIAXone (ROCEPHIN)  IV 2 g (12/23/20 0640)   lactated ringers 100 mL/hr at 12/22/20 2255    Results for orders placed or performed during the hospital encounter of 12/20/20 (from the past 24 hour(s))  CBC     Status: Abnormal   Collection Time: 12/23/20  4:38 AM  Result Value Ref Range   WBC 7.5 4.0 - 10.5 K/uL   RBC 3.69 (L) 3.87 - 5.11 MIL/uL   Hemoglobin 10.8 (L) 12.0 - 15.0 g/dL   HCT 33.0 (L) 36.0 - 46.0 %   MCV 89.4 80.0 - 100.0 fL   MCH 29.3 26.0 - 34.0 pg   MCHC 32.7 30.0 - 36.0 g/dL   RDW 13.3 11.5 - 15.5 %   Platelets 164 150 - 400 K/uL   nRBC 0.0 0.0 - 0.2 %  Basic metabolic panel     Status: Abnormal   Collection Time: 12/23/20  4:38 AM  Result Value Ref Range   Sodium 143 135 - 145 mmol/L   Potassium 3.1 (L) 3.5 - 5.1 mmol/L   Chloride 109 98 - 111 mmol/L  CO2 27 22 - 32 mmol/L   Glucose, Bld 91 70 - 99 mg/dL   BUN 11 6 - 20 mg/dL   Creatinine, Ser 0.61 0.44 - 1.00 mg/dL   Calcium 8.2 (L) 8.9 - 10.3 mg/dL   GFR, Estimated >60 >60 mL/min   Anion gap 7 5 - 15     CT ABDOMEN PELVIS W CONTRAST  Result Date: 12/21/2020 CLINICAL DATA:  Abdominal abscess/infection suspected. EXAM: CT ABDOMEN AND PELVIS WITH CONTRAST TECHNIQUE: Multidetector CT imaging of the abdomen and pelvis was performed using the standard protocol following bolus administration of intravenous contrast. CONTRAST:  11m OMNIPAQUE IOHEXOL 350 MG/ML SOLN COMPARISON:  CT yesterday. FINDINGS: Lower chest: Minimal dependent atelectasis at the right lung base. Hepatobiliary: Normal Pancreas: Chronic pancreatic calcifications in chronic ductal prominence. No evidence of focal mass. Spleen: Normal Adrenals/Urinary Tract: Adrenal glands are normal. Right kidney is normal. Mild fullness of the right renal collecting  system and ureter but no obstructing abnormality seen. Left kidney again shows what likely represents focal lobar nephronia with the renal abscess measuring about 3 cm in size. Adjacent edema in the Peri renal space. There does not appear to be a ureteral stone. No stone in either kidney. Stomach/Bowel: Stomach and small intestine are normal. No abnormal colon finding. Vascular/Lymphatic: Aortic atherosclerosis. No aneurysm. IVC is normal. No adenopathy. Reproductive: No pelvic mass. Musculoskeletal: Previous bilateral hip replacements. No spinal abnormality. IMPRESSION: Slight enlargement of the left renal process since yesterday, now measuring up to 3 cm. 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 Adjacent stranding in the Peri renal space. I do not think there is an obstructing ureteral lesion. Redemonstration of pancreatic calcification and ductal dilatation. As recommended yesterday, pancreatic MRI could be considered. Electronically Signed   By: MNelson ChimesM.D.   On: 12/21/2020 18:29   MR 3D Recon At Scanner  Result Date: 12/23/2020 CLINICAL DATA:  Pancreatic cancer surveillance in a 55year old female found to have a renal abnormality on recent CT imaging EXAM: MRI ABDOMEN WITHOUT AND WITH CONTRAST (INCLUDING MRCP) TECHNIQUE: Multiplanar multisequence MR imaging of the abdomen was performed both before and after the administration of intravenous contrast. Heavily T2-weighted images of the biliary and pancreatic ducts were obtained, and three-dimensional MRCP images were rendered by post processing. CONTRAST:  8.598mGADAVIST GADOBUTROL 1 MMOL/ML IV SOLN COMPARISON:  Comparison made with December 21, 2020 CT evaluation. FINDINGS: Lower chest: Focal area of abnormality in the LEFT inferior breast measuring 14 mm (image 9/7) no effusion or consolidation at the lung bases with limited assessment on MRI. Hepatobiliary: Cholelithiasis. No pericholecystic stranding. Numerous gallstones  in the gallbladder with 6 mm calculus in the gallbladder neck and similar and slightly larger size calculi layering dependently in the gallbladder lumen. Low insertion of posterior division RIGHT hepatic duct just above common hepatic duct/cystic duct confluence. No pericholecystic stranding. Mild biliary duct dilation at between 6 and 7 mm. Mild ductal irregularity in the head of the pancreas without visible filling defect. No focal, suspicious hepatic lesion. Portal vein is patent. Hepatic veins are patent. Pancreas: Pancreas with signs of atrophy and ductal dilation as on the recent CT. Large intraductal calculus in the head of the pancreas measures approximately 9 mm better seen on the CT as a discrete abnormality and within the main pancreatic duct leading to the upstream ductal obstruction with ductal caliber at 10 mm. No peripancreatic inflammation. Profound atrophy of peripheral pancreas. Little remaining intrinsicw T1 signal within the pancreatic  parenchyma. Post-contrast no discrete mass is visualized. Spleen:  Normal size and contour without focal splenic lesion. Adrenals/Urinary Tract:  Adrenal glands are normal. LEFT kidney: Stranding about the interpolar LEFT kidney with fluid collection in the interpolar LEFT kidney as seen on the recent CT. Mild diffuse urothelial enhancement bilaterally. The striated nephrogram pattern seen in the adjacent parenchyma is not as pronounced as on the recent CT evaluation. RIGHT kidney: Diffuse mild urothelial enhancement as discussed. No focal collection in the kidney or adjacent to the kidney. Stomach/Bowel: No acute gastrointestinal process. Limited assessment of gastrointestinal structures on abdominal MRI not performed for bowel evaluation. Vascular/Lymphatic: Patent abdominal vessels including splenic vein. There is no gastrohepatic or hepatoduodenal ligament lymphadenopathy. No retroperitoneal or mesenteric lymphadenopathy. Other:  No ascites. Musculoskeletal: No  suspicious bone lesions identified. Body wall edema similar to recent CT. IMPRESSION: 1. Large intraductal calculus in the head of the pancreas leading to ductal obstruction. Based on the size of this calculus and degree of ductal dilation would correlate with history of symptoms to determine whether further management may be warranted. No discrete mass is visualized. 2. Findings of pyelonephritis with small LEFT renal abscess, less inflammation about the kidney as compared to the prior study but still with diffuse urothelial enhancement throughout the upper tracts. 3. Cholelithiasis mild biliary duct distension without choledocholithiasis. Variant ductal anatomy with low insertion of posterior division RIGHT hepatic duct upon the common hepatic duct. 4. Focal area of abnormality in the LEFT inferior breast measuring 14 mm, correlate with physical examination. Correlation with prior mammography or follow-up mammographic assessment as warranted may be helpful. Electronically Signed   By: Zetta Bills M.D.   On: 12/23/2020 07:52   MR ABDOMEN MRCP W WO CONTAST  Result Date: 12/23/2020 CLINICAL DATA:  Pancreatic cancer surveillance in a 55 year old female found to have a renal abnormality on recent CT imaging EXAM: MRI ABDOMEN WITHOUT AND WITH CONTRAST (INCLUDING MRCP) TECHNIQUE: Multiplanar multisequence MR imaging of the abdomen was performed both before and after the administration of intravenous contrast. Heavily T2-weighted images of the biliary and pancreatic ducts were obtained, and three-dimensional MRCP images were rendered by post processing. CONTRAST:  8.71m GADAVIST GADOBUTROL 1 MMOL/ML IV SOLN COMPARISON:  Comparison made with December 21, 2020 CT evaluation. FINDINGS: Lower chest: Focal area of abnormality in the LEFT inferior breast measuring 14 mm (image 9/7) no effusion or consolidation at the lung bases with limited assessment on MRI. Hepatobiliary: Cholelithiasis. No pericholecystic stranding.  Numerous gallstones in the gallbladder with 6 mm calculus in the gallbladder neck and similar and slightly larger size calculi layering dependently in the gallbladder lumen. Low insertion of posterior division RIGHT hepatic duct just above common hepatic duct/cystic duct confluence. No pericholecystic stranding. Mild biliary duct dilation at between 6 and 7 mm. Mild ductal irregularity in the head of the pancreas without visible filling defect. No focal, suspicious hepatic lesion. Portal vein is patent. Hepatic veins are patent. Pancreas: Pancreas with signs of atrophy and ductal dilation as on the recent CT. Large intraductal calculus in the head of the pancreas measures approximately 9 mm better seen on the CT as a discrete abnormality and within the main pancreatic duct leading to the upstream ductal obstruction with ductal caliber at 10 mm. No peripancreatic inflammation. Profound atrophy of peripheral pancreas. Little remaining intrinsicw T1 signal within the pancreatic parenchyma. Post-contrast no discrete mass is visualized. Spleen:  Normal size and contour without focal splenic lesion. Adrenals/Urinary Tract:  Adrenal glands are normal. LEFT  kidney: Stranding about the interpolar LEFT kidney with fluid collection in the interpolar LEFT kidney as seen on the recent CT. Mild diffuse urothelial enhancement bilaterally. The striated nephrogram pattern seen in the adjacent parenchyma is not as pronounced as on the recent CT evaluation. RIGHT kidney: Diffuse mild urothelial enhancement as discussed. No focal collection in the kidney or adjacent to the kidney. Stomach/Bowel: No acute gastrointestinal process. Limited assessment of gastrointestinal structures on abdominal MRI not performed for bowel evaluation. Vascular/Lymphatic: Patent abdominal vessels including splenic vein. There is no gastrohepatic or hepatoduodenal ligament lymphadenopathy. No retroperitoneal or mesenteric lymphadenopathy. Other:  No ascites.  Musculoskeletal: No suspicious bone lesions identified. Body wall edema similar to recent CT. IMPRESSION: 1. Large intraductal calculus in the head of the pancreas leading to ductal obstruction. Based on the size of this calculus and degree of ductal dilation would correlate with history of symptoms to determine whether further management may be warranted. No discrete mass is visualized. 2. Findings of pyelonephritis with small LEFT renal abscess, less inflammation about the kidney as compared to the prior study but still with diffuse urothelial enhancement throughout the upper tracts. 3. Cholelithiasis mild biliary duct distension without choledocholithiasis. Variant ductal anatomy with low insertion of posterior division RIGHT hepatic duct upon the common hepatic duct. 4. Focal area of abnormality in the LEFT inferior breast measuring 14 mm, correlate with physical examination. Correlation with prior mammography or follow-up mammographic assessment as warranted may be helpful. Electronically Signed   By: Zetta Bills M.D.   On: 12/23/2020 07:52    ROS:  As stated above in the HPI otherwise negative.  Blood pressure (!) 151/86, pulse 89, temperature 97.9 F (36.6 C), resp. rate 17, height 5' 7.25" (1.708 m), weight 84.2 kg, last menstrual period 03/02/2014, SpO2 99 %.    PE: Gen: NAD, Alert and Oriented HEENT:  [place]/AT, EOMI Neck: Supple, no LAD Lungs: CTA Bilaterally CV: RRR without M/G/R ABD: Soft, NTND, +BS Ext: No C/C/E  Assessment/Plan: 1) Pancreatic ductal stone. 2) PD dilation. 3) Chronic calcific pancreatitis, by definition. 4) Left lobar nephronia. 5) E. Coli sepsis. 6) Diarrhea.   Prior to this presentation the patient denied any issues with pancreatitis.  She was asymptomatic with pain, but she did notice having diarrhea for the past month.  Initially she thought that it was as a result of her job change, but she continued to have this problem.  Weight loss was not a complaint.   Here in the hospital she had a near normal bowel movement.  There may be a component of pancreatic insufficiency.  Plan: 1) Continue to manage the nephronia. 2) Trial of Creon 72,000 units TID. 3) Follow up as an outpatient.  Lesle Faron D 12/23/2020, 12:03 PM

## 2020-12-23 NOTE — Plan of Care (Signed)
  Problem: Education: Goal: Knowledge of General Education information will improve Description: Including pain rating scale, medication(s)/side effects and non-pharmacologic comfort measures 12/23/2020 1838 by Tanda Rockers, RN Outcome: Adequate for Discharge 12/23/2020 1838 by Tanda Rockers, RN Outcome: Not Progressing   Problem: Health Behavior/Discharge Planning: Goal: Ability to manage health-related needs will improve 12/23/2020 1838 by Tanda Rockers, RN Outcome: Adequate for Discharge 12/23/2020 1838 by Tanda Rockers, RN Outcome: Not Progressing   Problem: Clinical Measurements: Goal: Ability to maintain clinical measurements within normal limits will improve 12/23/2020 1838 by Tanda Rockers, RN Outcome: Adequate for Discharge 12/23/2020 1838 by Tanda Rockers, RN Outcome: Not Progressing Goal: Will remain free from infection 12/23/2020 1838 by Tanda Rockers, RN Outcome: Adequate for Discharge 12/23/2020 1838 by Tanda Rockers, RN Outcome: Not Progressing Goal: Diagnostic test results will improve 12/23/2020 1838 by Tanda Rockers, RN Outcome: Adequate for Discharge 12/23/2020 1838 by Tanda Rockers, RN Outcome: Not Progressing Goal: Respiratory complications will improve 12/23/2020 1838 by Tanda Rockers, RN Outcome: Adequate for Discharge 12/23/2020 1838 by Tanda Rockers, RN Outcome: Not Progressing Goal: Cardiovascular complication will be avoided 12/23/2020 1838 by Tanda Rockers, RN Outcome: Adequate for Discharge 12/23/2020 1838 by Tanda Rockers, RN Outcome: Not Progressing   Problem: Activity: Goal: Risk for activity intolerance will decrease 12/23/2020 1838 by Tanda Rockers, RN Outcome: Adequate for Discharge 12/23/2020 1838 by Tanda Rockers, RN Outcome: Not Progressing   Problem: Nutrition: Goal: Adequate nutrition will be maintained 12/23/2020 1838 by Tanda Rockers, RN Outcome: Adequate for  Discharge 12/23/2020 1838 by Tanda Rockers, RN Outcome: Not Progressing   Problem: Coping: Goal: Level of anxiety will decrease 12/23/2020 1838 by Tanda Rockers, RN Outcome: Adequate for Discharge 12/23/2020 1838 by Tanda Rockers, RN Outcome: Not Progressing   Problem: Elimination: Goal: Will not experience complications related to bowel motility 12/23/2020 1838 by Tanda Rockers, RN Outcome: Adequate for Discharge 12/23/2020 1838 by Tanda Rockers, RN Outcome: Not Progressing Goal: Will not experience complications related to urinary retention 12/23/2020 1838 by Tanda Rockers, RN Outcome: Adequate for Discharge 12/23/2020 1838 by Tanda Rockers, RN Outcome: Not Progressing   Problem: Pain Managment: Goal: General experience of comfort will improve 12/23/2020 1838 by Tanda Rockers, RN Outcome: Adequate for Discharge 12/23/2020 1838 by Tanda Rockers, RN Outcome: Not Progressing   Problem: Safety: Goal: Ability to remain free from injury will improve 12/23/2020 1838 by Tanda Rockers, RN Outcome: Adequate for Discharge 12/23/2020 1838 by Tanda Rockers, RN Outcome: Not Progressing   Problem: Skin Integrity: Goal: Risk for impaired skin integrity will decrease 12/23/2020 1838 by Tanda Rockers, RN Outcome: Adequate for Discharge 12/23/2020 1838 by Tanda Rockers, RN Outcome: Not Progressing

## 2020-12-23 NOTE — Discharge Summary (Signed)
Discharge Summary  Linda Chang WUJ:[id number] DOB: Feb 15, 1966  PCP: Patient, No Pcp Per (Inactive)  Admit date: 12/20/2020 Discharge date: 12/23/2020  Time spent: 45 minutes  Recommendations for Outpatient Follow-up:  New medication: Cefdinir 300 mg p.o. twice daily x28 days Patient will follow up with Dr. Benson Norway gastroenterology in 1 month New medication: Creon p.o. 3 times daily with meals Patient has been set up with a new primary care physician at primary care at [facility] on 8/23 at 11 AM Patient will follow up with Dr. Juleen China, infectious disease on 8/29 in the infectious disease clinic  Discharge Diagnoses:  Active Hospital Problems   Diagnosis Date Noted   Sepsis due to Escherichia coli (E. 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    Discharge Condition: Improved, being discharged home  Diet recommendation: Heart healthy  Vitals:   12/23/20 0542 12/23/20 1426  BP: (!) 151/86 (!) 147/91  Pulse: 89 79  Resp: 17 16  Temp: 97.9 F (36.6 C) 98 F (36.7 C)  SpO2: 99% 100%    History of present illness:  55 year old female with past medical history of COPD and tobacco use admitted on 8/1 for sepsis secondary to pyelonephritis.  Blood cultures positive for E. coli.  Initial CT scan of abdomen and pelvis without contrast noted 2.3 cm low-density lesion in left kidney concerning for small abscess  Hospital Course:  Principal Problem: E. coli sepsis from pyelonephritis: Patient met criteria for sepsis on admission with heart rate greater than 90 and temp >100.5.  Continue IV Zosyn.  White blood cell count has since normalized.  Given gram-negative rod bacteremia, infectious disease consulted.  Given small size of abscess  and clinical improvement, agreed with plans to not aspirate or drain at this time.  Patient does not have insurance so through social work, set up with the [employer] and as per ID recommendations, discharged on cefdinir 300 mg p.o. twice daily x28 days.  Patient will follow up with infectious disease on 8/29 to determine final duration of therapy.  Since she is uninsured, getting repeat imaging for follow-up will likely be prohibitive.   Active Problems:   COPD (chronic obstructive pulmonary disease) ([place]): Stable, on room air.     Nicotine dependence: Continue nicotine patch.     Overweight (BMI 25.0-29.9): Patient meets criteria BMI greater than 25.   Calculus of pancreatic head: incidentally noted of ductal dilatation of pancreas seen on abdominal CT.  Follow-up pancreatic MRI done 8/3 noted pancreatic atrophy and ductal dilatation with calculus in the pancreatic head.  calculus of pancreatic head.  Dr. Benson Norway from gastroenterology consulted and saw patient on 8/4.  Patient reportedly has had no issues with pancreatitis although she has had some issues with diarrhea, which she attributed to stress.  Dr. Benson Norway recommended outpatient follow-up and trial of Creon, 72,000 units 3 times daily.  Consultants: Urology Infectious disease Gastroenterology   Procedures: None  Discharge Exam: BP (!) 147/91 (BP Location: Right Arm)   Pulse 79   Temp 98 F (36.7 C) (Oral)   Resp 16   Ht 5' 7.25" (1.708 m)   Wt 84.2 kg   LMP 03/02/2014   SpO2 100%   BMI 28.87 kg/m   General: Alert and oriented x3, no acute distress Cardiovascular:  Regular rate and rhythm, S1-S2 Respiratory: Clear to auscultation bilaterally  Discharge Instructions You were cared for by a hospitalist during your hospital stay. If you have any questions about your discharge medications or the care you received while you were in the hospital after you are discharged, you can call the unit and asked to speak with the  hospitalist on call if the hospitalist that took care of you is not available. Once you are discharged, your primary care physician will handle any further medical issues. Please note that NO REFILLS for any discharge medications will be authorized once you are discharged, as it is imperative that you return to your primary care physician (or establish a relationship with a primary care physician if you do not have one) for your aftercare needs so that they can reassess your need for medications and monitor your lab values.  Discharge Instructions     Diet - low sodium heart healthy   Complete by: As directed    Increase activity slowly   Complete by: As directed       Allergies as of 12/23/2020       Reactions   Morphine And Related Other (See Comments)   "makes me feel bad"        Medication List     TAKE these medications    albuterol 108 (90 Base) MCG/ACT inhaler Commonly known as: VENTOLIN HFA Inhale 1-2 puffs into the lungs every 6 (six) hours as needed for wheezing or shortness of breath.   BC HEADACHE PO Take 1 packet by mouth daily as needed (pain).   cefdinir 300 MG capsule Commonly known as: OMNICEF Take 1 capsule (300 mg total) by mouth 2 (two) times daily for 28 days.   lipase/protease/amylase 36000 UNITS Cpep capsule Commonly known as: CREON Take 2 capsules (72,000 Units total) by mouth 3 (three) times daily before meals.        Allergies  Allergen Reactions   Morphine And Related Other (See Comments)    "makes me feel bad"    Follow-up Information     Carol Ada, MD. Schedule an appointment as soon as possible for a visit in 1 month(s).   Specialty: Gastroenterology Contact information: [place], [place] [address] [place], [place], [place]. Schedule an appointment as soon as possible for a visit on 01/17/2021.   Specialties: Infectious Diseases, Internal Medicine Why: Call to confirm appt Contact  information: [place] [address] [phone]         [facility] Follow up on 01/11/2021.   Why: Be there at 10:45 am for new primary care doctor appt. Contact information: [address], [place] [address]                 The results of significant diagnostics from this hospitalization (including imaging, microbiology, ancillary and laboratory) are listed below for reference.    Significant Diagnostic Studies: CT ABDOMEN PELVIS W CONTRAST  Result Date: 12/21/2020 CLINICAL DATA:  Abdominal abscess/infection suspected. EXAM: CT ABDOMEN AND PELVIS WITH CONTRAST TECHNIQUE: Multidetector CT imaging of the abdomen and pelvis was performed using the standard protocol following bolus administration of intravenous contrast. CONTRAST:  80m OMNIPAQUE IOHEXOL 350 MG/ML SOLN COMPARISON:  CT yesterday. FINDINGS: Lower chest: Minimal dependent atelectasis at the right lung base. Hepatobiliary: Normal Pancreas: Chronic pancreatic calcifications in chronic ductal prominence. No evidence of focal mass.  Spleen: Normal Adrenals/Urinary Tract: Adrenal glands are normal. Right kidney is normal. Mild fullness of the right renal collecting system and ureter but no obstructing abnormality seen. Left kidney again shows what likely represents focal lobar nephronia with the renal abscess measuring about 3 cm in size. Adjacent edema in the Peri renal space. There does not appear to be a ureteral stone. No stone in either kidney. Stomach/Bowel: Stomach and small intestine are normal. No abnormal colon finding. Vascular/Lymphatic: Aortic atherosclerosis. No aneurysm. IVC is normal. No adenopathy. Reproductive: No pelvic mass. Musculoskeletal: Previous bilateral hip replacements. No spinal abnormality. IMPRESSION: Slight enlargement of the left renal process since yesterday, now measuring up to 3 cm. 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 Adjacent stranding in the Peri renal space. I do not think there is an obstructing ureteral lesion. Redemonstration of pancreatic calcification and ductal dilatation. As recommended yesterday, pancreatic MRI could be considered. Electronically Signed   By: Nelson Chimes M.D.   On: 12/21/2020 18:29   MR 3D Recon At Scanner  Result Date: 12/23/2020 CLINICAL DATA:  Pancreatic cancer surveillance in a 55 year old female found to have a renal abnormality on recent CT imaging EXAM: MRI ABDOMEN WITHOUT AND WITH CONTRAST (INCLUDING MRCP) TECHNIQUE: Multiplanar multisequence MR imaging of the abdomen was performed both before and after the administration of intravenous contrast. Heavily T2-weighted images of the biliary and pancreatic ducts were obtained, and three-dimensional MRCP images were rendered by post processing. CONTRAST:  8.40m GADAVIST GADOBUTROL 1 MMOL/ML IV SOLN COMPARISON:  Comparison made with December 21, 2020 CT evaluation. FINDINGS: Lower chest: Focal area of abnormality in the LEFT inferior breast measuring 14 mm (image 9/7) no effusion or consolidation at the lung bases with limited assessment on MRI. Hepatobiliary: Cholelithiasis. No pericholecystic stranding. Numerous gallstones in the gallbladder with 6 mm calculus in the gallbladder neck and similar and slightly larger size calculi layering dependently in the gallbladder lumen. Low insertion of posterior division RIGHT hepatic duct just above common hepatic duct/cystic duct confluence. No pericholecystic stranding. Mild biliary duct dilation at between 6 and 7 mm. Mild ductal irregularity in the head of the pancreas without visible filling defect. No focal, suspicious hepatic lesion. Portal vein is patent. Hepatic veins are patent. Pancreas: Pancreas with signs of atrophy and ductal dilation as on the recent CT. Large intraductal calculus in the head of the pancreas measures approximately 9 mm better seen on the CT  as a discrete abnormality and within the main pancreatic duct leading to the upstream ductal obstruction with ductal caliber at 10 mm. No peripancreatic inflammation. Profound atrophy of peripheral pancreas. Little remaining intrinsicw T1 signal within the pancreatic parenchyma. Post-contrast no discrete mass is visualized. Spleen:  Normal size and contour without focal splenic lesion. Adrenals/Urinary Tract:  Adrenal glands are normal. LEFT kidney: Stranding about the interpolar LEFT kidney with fluid collection in the interpolar LEFT kidney as seen on the recent CT. Mild diffuse urothelial enhancement bilaterally. The striated nephrogram pattern seen in the adjacent parenchyma is not as pronounced as on the recent CT evaluation. RIGHT kidney: Diffuse mild urothelial enhancement as discussed. No focal collection in the kidney or adjacent to the kidney. Stomach/Bowel: No acute gastrointestinal process. Limited assessment of gastrointestinal structures on abdominal MRI not performed for bowel evaluation. Vascular/Lymphatic: Patent abdominal vessels including splenic vein. There is no gastrohepatic or hepatoduodenal ligament lymphadenopathy. No retroperitoneal or mesenteric lymphadenopathy. Other:  No ascites. Musculoskeletal: No suspicious bone lesions identified. Body wall edema similar  to recent CT. IMPRESSION: 1. Large intraductal calculus in the head of the pancreas leading to ductal obstruction. Based on the size of this calculus and degree of ductal dilation would correlate with history of symptoms to determine whether further management may be warranted. No discrete mass is visualized. 2. Findings of pyelonephritis with small LEFT renal abscess, less inflammation about the kidney as compared to the prior study but still with diffuse urothelial enhancement throughout the upper tracts. 3. Cholelithiasis mild biliary duct distension without choledocholithiasis. Variant ductal anatomy with low insertion of  posterior division RIGHT hepatic duct upon the common hepatic duct. 4. Focal area of abnormality in the LEFT inferior breast measuring 14 mm, correlate with physical examination. Correlation with prior mammography or follow-up mammographic assessment as warranted may be helpful. Electronically Signed   By: Zetta Bills M.D.   On: 12/23/2020 07:52   MR ABDOMEN MRCP W WO CONTAST  Result Date: 12/23/2020 CLINICAL DATA:  Pancreatic cancer surveillance in a 55 year old female found to have a renal abnormality on recent CT imaging EXAM: MRI ABDOMEN WITHOUT AND WITH CONTRAST (INCLUDING MRCP) TECHNIQUE: Multiplanar multisequence MR imaging of the abdomen was performed both before and after the administration of intravenous contrast. Heavily T2-weighted images of the biliary and pancreatic ducts were obtained, and three-dimensional MRCP images were rendered by post processing. CONTRAST:  8.9m GADAVIST GADOBUTROL 1 MMOL/ML IV SOLN COMPARISON:  Comparison made with December 21, 2020 CT evaluation. FINDINGS: Lower chest: Focal area of abnormality in the LEFT inferior breast measuring 14 mm (image 9/7) no effusion or consolidation at the lung bases with limited assessment on MRI. Hepatobiliary: Cholelithiasis. No pericholecystic stranding. Numerous gallstones in the gallbladder with 6 mm calculus in the gallbladder neck and similar and slightly larger size calculi layering dependently in the gallbladder lumen. Low insertion of posterior division RIGHT hepatic duct just above common hepatic duct/cystic duct confluence. No pericholecystic stranding. Mild biliary duct dilation at between 6 and 7 mm. Mild ductal irregularity in the head of the pancreas without visible filling defect. No focal, suspicious hepatic lesion. Portal vein is patent. Hepatic veins are patent. Pancreas: Pancreas with signs of atrophy and ductal dilation as on the recent CT. Large intraductal calculus in the head of the pancreas measures approximately 9 mm  better seen on the CT as a discrete abnormality and within the main pancreatic duct leading to the upstream ductal obstruction with ductal caliber at 10 mm. No peripancreatic inflammation. Profound atrophy of peripheral pancreas. Little remaining intrinsicw T1 signal within the pancreatic parenchyma. Post-contrast no discrete mass is visualized. Spleen:  Normal size and contour without focal splenic lesion. Adrenals/Urinary Tract:  Adrenal glands are normal. LEFT kidney: Stranding about the interpolar LEFT kidney with fluid collection in the interpolar LEFT kidney as seen on the recent CT. Mild diffuse urothelial enhancement bilaterally. The striated nephrogram pattern seen in the adjacent parenchyma is not as pronounced as on the recent CT evaluation. RIGHT kidney: Diffuse mild urothelial enhancement as discussed. No focal collection in the kidney or adjacent to the kidney. Stomach/Bowel: No acute gastrointestinal process. Limited assessment of gastrointestinal structures on abdominal MRI not performed for bowel evaluation. Vascular/Lymphatic: Patent abdominal vessels including splenic vein. There is no gastrohepatic or hepatoduodenal ligament lymphadenopathy. No retroperitoneal or mesenteric lymphadenopathy. Other:  No ascites. Musculoskeletal: No suspicious bone lesions identified. Body wall edema similar to recent CT. IMPRESSION: 1. Large intraductal calculus in the head of the pancreas leading to ductal obstruction. Based on the size of this calculus  and degree of ductal dilation would correlate with history of symptoms to determine whether further management may be warranted. No discrete mass is visualized. 2. Findings of pyelonephritis with small LEFT renal abscess, less inflammation about the kidney as compared to the prior study but still with diffuse urothelial enhancement throughout the upper tracts. 3. Cholelithiasis mild biliary duct distension without choledocholithiasis. Variant ductal anatomy with  low insertion of posterior division RIGHT hepatic duct upon the common hepatic duct. 4. Focal area of abnormality in the LEFT inferior breast measuring 14 mm, correlate with physical examination. Correlation with prior mammography or follow-up mammographic assessment as warranted may be helpful. Electronically Signed   By: Zetta Bills M.D.   On: 12/23/2020 07:52   CT RENAL STONE STUDY  Result Date: 12/20/2020 CLINICAL DATA:  Left flank pain EXAM: CT ABDOMEN AND PELVIS WITHOUT CONTRAST TECHNIQUE: Multidetector CT imaging of the abdomen and pelvis was performed following the standard protocol without IV contrast. COMPARISON:  None. FINDINGS: Lower chest: No acute abnormality Hepatobiliary: Subtle high-density material seen within the gallbladder could reflect stones or sludge. No focal hepatic abnormality. Pancreas: Calcifications in the pancreatic head and uncinate process likely reflect changes of chronic pancreatitis. There is pancreatic atrophy. Mild pancreatic ductal dilatation measures up to 9 mm in the pancreatic body. Spleen: No focal abnormality.  Normal size. Adrenals/Urinary Tract: There is mild perinephric stranding surrounding the left kidney, vertically the mid and lower poles where there is a 2.2 cm low-density area in the midpole of the left kidney. No overt hydronephrosis. There is a calcification in the left side of the pelvis measuring 3 mm. It is difficult to follow the distal left ureter due to its decompressed state. I favor this is a phlebolith although a distal left ureteral stone cannot be completely excluded. Adrenal glands and urinary bladder unremarkable. Stomach/Bowel: Normal appendix. Stomach, large and small bowel grossly unremarkable. Vascular/Lymphatic: Aortic atherosclerosis. No evidence of aneurysm or adenopathy. Reproductive: Uterus and adnexa unremarkable.  No mass. Other: No free fluid or free air. Musculoskeletal: No acute bony abnormality. Bilateral hip replacements.  IMPRESSION: Calcification in the left side of the pelvis which could reflect small left ureteral stone or phlebolith. The distal ureter is difficult to follow. Favor phlebolith. 2.3 cm low-density lesion in the mid to lower pole of the left kidney with surrounding perinephric stranding. Cannot exclude infection such as pyelonephritis with small abscess. Recommend clinical correlation for signs of urinary tract infection. Calcifications in the pancreatic head. Pancreatic atrophy and ductal dilatation. This likely is related to chronic pancreatitis but could be further evaluated with pancreatic protocol MRI. High-density material layering within the gallbladder could reflect small stones or sludge. Electronically Signed   By: Rolm Baptise M.D.   On: 12/20/2020 09:11    Microbiology: Recent Results (from the past 240 hour(s))  Resp Panel by RT-PCR (Flu A&B, Covid) Nasopharyngeal Swab     Status: None   Collection Time: 12/20/20  7:58 AM   Specimen: Nasopharyngeal Swab; Nasopharyngeal(NP) swabs in vial transport medium  Result Value Ref Range Status   SARS Coronavirus 2 by RT PCR NEGATIVE NEGATIVE Final    Comment: (NOTE) SARS-CoV-2 target nucleic acids are NOT DETECTED.  The SARS-CoV-2 RNA is generally detectable in upper respiratory specimens during the acute phase of infection. The lowest concentration of SARS-CoV-2 viral copies this assay can detect is 138 copies/mL. A negative result does not preclude SARS-Cov-2 infection and should not be used as the sole basis for treatment or other  patient management decisions. A negative result may occur with  improper specimen collection/handling, submission of specimen other than nasopharyngeal swab, presence of viral mutation(s) within the areas targeted by this assay, and inadequate number of viral copies(<138 copies/mL). A negative result must be combined with clinical observations, patient history, and epidemiological information. The expected  result is Negative.  Fact Sheet for Patients:  EntrepreneurPulse.com.au  Fact Sheet for Healthcare Providers:  IncredibleEmployment.be  This test is no t yet approved or cleared by the [place] FDA and  has been authorized for detection and/or diagnosis of SARS-CoV-2 by FDA under an Emergency Use Authorization (EUA). This EUA will remain  in effect (meaning this test can be used) for the duration of the COVID-19 declaration under Section 564(b)(1) of the Act, 21 U.S.C.section 360bbb-3(b)(1), unless the authorization is terminated  or revoked sooner.       Influenza A by PCR NEGATIVE NEGATIVE Final   Influenza B by PCR NEGATIVE NEGATIVE Final    Comment: (NOTE) The Xpert Xpress SARS-CoV-2/FLU/RSV plus assay is intended as an aid in the diagnosis of influenza from Nasopharyngeal swab specimens and should not be used as a sole basis for treatment. Nasal washings and aspirates are unacceptable for Xpert Xpress SARS-CoV-2/FLU/RSV testing.  Fact Sheet for Patients: EntrepreneurPulse.com.au  Fact Sheet for Healthcare Providers: IncredibleEmployment.be  This test is not yet approved or cleared by the [place] FDA and has been authorized for detection and/or diagnosis of SARS-CoV-2 by FDA under an Emergency Use Authorization (EUA). This EUA will remain in effect (meaning this test can be used) for the duration of the COVID-19 declaration under Section 564(b)(1) of the Act, 21 U.S.C. section 360bbb-3(b)(1), unless the authorization is terminated or revoked.  Performed at [facility], [place] [address] [place], [place] [address]   Urine Culture     Status: Abnormal   Collection Time: 12/20/20  9:23 AM   Specimen: Urine, Clean Catch  Result Value Ref Range Status   Specimen Description   Final    URINE, CLEAN CATCH Performed at [facility], [place] [address]  [place], [place] [address]    Special Requests   Final    NONE Performed at [facility], [place] [address], [place], [place] [address]    Culture >=100,000 COLONIES/mL ESCHERICHIA COLI (A)  Final   Report Status 12/22/2020 FINAL  Final   Organism ID, Bacteria ESCHERICHIA COLI (A)  Final      Susceptibility   Escherichia coli - MIC*    AMPICILLIN <=2 SENSITIVE Sensitive     CEFAZOLIN <=4 SENSITIVE Sensitive     CEFEPIME <=0.12 SENSITIVE Sensitive     CEFTRIAXONE <=0.25 SENSITIVE Sensitive     CIPROFLOXACIN <=0.25 SENSITIVE Sensitive     GENTAMICIN <=1 SENSITIVE Sensitive     IMIPENEM <=0.25 SENSITIVE Sensitive     NITROFURANTOIN <=16 SENSITIVE Sensitive     TRIMETH/SULFA <=20 SENSITIVE Sensitive     AMPICILLIN/SULBACTAM <=2 SENSITIVE Sensitive     PIP/TAZO <=4 SENSITIVE Sensitive     * >=100,000 COLONIES/mL ESCHERICHIA COLI  Blood culture (routine x 2)     Status: Abnormal   Collection Time: 12/20/20 10:44 AM   Specimen: BLOOD  Result Value Ref Range Status   Specimen Description   Final    BLOOD LEFT ANTECUBITAL Performed at [facility] [address], [place], [place] [address]    Special Requests   Final    BOTTLES DRAWN AEROBIC AND ANAEROBIC Blood  Culture adequate volume Performed at [facility] [address], [place], [place] [address]    Culture  Setup Time   Final    GRAM NEGATIVE RODS IN BOTH AEROBIC AND ANAEROBIC BOTTLES Organism ID to follow CRITICAL RESULT CALLED TO, READ BACK BY AND VERIFIED WITH: Irwin Brakeman, AT [id number] 12/21/20 [place] Performed at [facility], [place] [address] [place], [place] [address]    Culture ESCHERICHIA COLI (A)  Final   Report Status 12/22/2020 FINAL  Final   Organism ID, Bacteria ESCHERICHIA COLI  Final      Susceptibility   Escherichia coli - MIC*    AMPICILLIN <=2 SENSITIVE Sensitive     CEFAZOLIN <=4 SENSITIVE Sensitive     CEFEPIME <=0.12 SENSITIVE Sensitive      CEFTAZIDIME <=1 SENSITIVE Sensitive     CEFTRIAXONE <=0.25 SENSITIVE Sensitive     CIPROFLOXACIN <=0.25 SENSITIVE Sensitive     GENTAMICIN <=1 SENSITIVE Sensitive     IMIPENEM <=0.25 SENSITIVE Sensitive     TRIMETH/SULFA <=20 SENSITIVE Sensitive     AMPICILLIN/SULBACTAM <=2 SENSITIVE Sensitive     PIP/TAZO <=4 SENSITIVE Sensitive     * ESCHERICHIA COLI  Blood Culture ID Panel (Reflexed)     Status: Abnormal   Collection Time: 12/20/20 10:44 AM  Result Value Ref Range Status   Enterococcus faecalis NOT DETECTED NOT DETECTED Final   Enterococcus Faecium NOT DETECTED NOT DETECTED Final   Listeria monocytogenes NOT DETECTED NOT DETECTED Final   Staphylococcus species NOT DETECTED NOT DETECTED Final   Staphylococcus aureus (BCID) NOT DETECTED NOT DETECTED Final   Staphylococcus epidermidis NOT DETECTED NOT DETECTED Final   Staphylococcus lugdunensis NOT DETECTED NOT DETECTED Final   Streptococcus species NOT DETECTED NOT DETECTED Final   Streptococcus agalactiae NOT DETECTED NOT DETECTED Final   Streptococcus pneumoniae NOT DETECTED NOT DETECTED Final   Streptococcus pyogenes NOT DETECTED NOT DETECTED Final   A.calcoaceticus-baumannii NOT DETECTED NOT DETECTED Final   Bacteroides fragilis NOT DETECTED NOT DETECTED Final   Enterobacterales DETECTED (A) NOT DETECTED Final    Comment: Enterobacterales represent a large order of gram negative bacteria, not a single organism. CRITICAL RESULT CALLED TO, READ BACK BY AND VERIFIED WITH: Sheffield Slider PHARMD, AT 0016 12/21/20 D. VANHOOK    Enterobacter cloacae complex NOT DETECTED NOT DETECTED Final   Escherichia coli DETECTED (A) NOT DETECTED Final    Comment: CRITICAL RESULT CALLED TO, READ BACK BY AND VERIFIED WITH: Sheffield Slider PHARMD, AT 0016 12/21/20 D. VANHOOK    Klebsiella aerogenes NOT DETECTED NOT DETECTED Final   Klebsiella oxytoca NOT DETECTED NOT DETECTED Final   Klebsiella pneumoniae NOT DETECTED NOT DETECTED Final   Proteus  species NOT DETECTED NOT DETECTED Final   Salmonella species NOT DETECTED NOT DETECTED Final   Serratia marcescens NOT DETECTED NOT DETECTED Final   Haemophilus influenzae NOT DETECTED NOT DETECTED Final   Neisseria meningitidis NOT DETECTED NOT DETECTED Final   Pseudomonas aeruginosa NOT DETECTED NOT DETECTED Final   Stenotrophomonas maltophilia NOT DETECTED NOT DETECTED Final   Candida albicans NOT DETECTED NOT DETECTED Final   Candida auris NOT DETECTED NOT DETECTED Final   Candida glabrata NOT DETECTED NOT DETECTED Final   Candida krusei NOT DETECTED NOT DETECTED Final   Candida parapsilosis NOT DETECTED NOT DETECTED Final   Candida tropicalis NOT DETECTED NOT DETECTED Final   Cryptococcus neoformans/gattii NOT DETECTED NOT DETECTED Final   CTX-M ESBL NOT DETECTED NOT DETECTED Final   Carbapenem  resistance IMP NOT DETECTED NOT DETECTED Final   Carbapenem resistance KPC NOT DETECTED NOT DETECTED Final   Carbapenem resistance NDM NOT DETECTED NOT DETECTED Final   Carbapenem resist OXA 48 LIKE NOT DETECTED NOT DETECTED Final   Carbapenem resistance VIM NOT DETECTED NOT DETECTED Final    Comment: Performed at [facility], [place] [address] [place], [place] [address]  Blood culture (routine x 2)     Status: Abnormal   Collection Time: 12/20/20 10:45 AM   Specimen: BLOOD  Result Value Ref Range Status   Specimen Description   Final    BLOOD RIGHT ANTECUBITAL Performed at [facility] [address], [place], [place] [address]    Special Requests   Final    BOTTLES DRAWN AEROBIC AND ANAEROBIC Blood Culture results may not be optimal due to an inadequate volume of blood received in culture bottles Performed at [facility] [address] [place], [place] [address]    Culture  Setup Time   Final    GRAM NEGATIVE RODS IN BOTH AEROBIC AND ANAEROBIC BOTTLES    Culture (A)  Final    ESCHERICHIA COLI SUSCEPTIBILITIES PERFORMED ON PREVIOUS  CULTURE WITHIN THE LAST 5 DAYS. Performed at [facility], [place] [address] [place], [place] [address]    Report Status 12/22/2020 FINAL  Final  MRSA Next Gen by PCR, Nasal     Status: Abnormal   Collection Time: 12/21/20  7:39 PM   Specimen: Nasal Mucosa; Nasal Swab  Result Value Ref Range Status   MRSA by PCR Next Gen DETECTED (A) NOT DETECTED Final    Comment: RESULT CALLED TO, READ BACK BY AND VERIFIED WITH: PEREZ,A RN _0  ON 12/21/20 JACKSON,K (NOTE) The GeneXpert MRSA Assay (FDA approved for NASAL specimens only), is one component of a comprehensive MRSA colonization surveillance program. It is not intended to diagnose MRSA infection nor to guide or monitor treatment for MRSA infections. Test performance is not FDA approved in patients less than 36 years old. Performed at [facility], [place] [address], [place], [place] [address]      Labs: Basic Metabolic Panel: Recent Labs  Lab 12/20/20 0744 12/21/20 0408 12/22/20 0423 12/23/20 0438  NA 139 140 142 143  K 3.4* 4.0 3.4* 3.1*  CL 103 112* 112* 109  CO2 _1 GLUCOSE 173* 101* 91 91  BUN _2 CREATININE 0.97 0.88 0.65 0.61  CALCIUM 9.2 7.9* 8.4* 8.2*  MG  --  1.9  --   --    Liver Function Tests: Recent Labs  Lab 12/20/20 0744 12/21/20 0408  AST 14* 16  ALT 15 17  ALKPHOS 85 95  BILITOT 0.4 0.5  PROT 6.8 5.3*  ALBUMIN 3.5 2.6*   Recent Labs  Lab 12/21/20 0408  LIPASE 19   No results for input(s): AMMONIA in the last 168 hours. CBC: Recent Labs  Lab 12/20/20 0744 12/21/20 0408 12/22/20 0423 12/23/20 0438  WBC 11.0* 14.7* 10.0 7.5  NEUTROABS 9.7*  --   --   --   HGB 12.8 11.4* 10.4* 10.8*  HCT 39.2 35.4* 32.1* 33.0*  MCV 90.5 92.7 91.2 89.4  PLT 214 167 160 164   Cardiac Enzymes: No results for input(s): CKTOTAL, CKMB, CKMBINDEX, TROPONINI in the last 168 hours. BNP: BNP (last 3 results) No results for input(s): BNP in the last 8760 hours.  ProBNP  (last 3 results) No results  for input(s): PROBNP in the last 8760 hours.  CBG: No results for input(s): GLUCAP in the last 168 hours.     Signed:  Annita Brod, MD [facility] 12/23/2020, 5:17 PM

## 2020-12-23 NOTE — Consult Note (Signed)
[facility] for Infectious Disease    Date of Admission:  12/20/2020     Reason for Consult: E coli bacteremia, renal abscess     Referring Physician: Dr Tacy Learn this admit: Ceftriaxone 12/20/20  ASSESSMENT & PLAN:    E coli bacteremia secondary to urinary source with pyelonephritis and small (< 3.0cm) left renal abscess.  Given the small size of abscess and clinical improvement there are no current plans for aspiration/drainage.  Will plan for continued ceftriaxone 2 gm daily while inpatient and then transition to cefdinir '300mg'$  BID at discharge for 3-4 weeks.  Will see her back for follow up in clinic on 01/17/21 to determine final duration of therapy.  She is uninsured so will work on getting her a 4-week supply of medication prior to her leaving.  Additionally, since she is uninsured getting repeat imaging for follow up will likely be prohibitive and will likely need to guide decision making based on clinical improvement.  Will also check inflammatory markers today to establish baseline and for follow up purposes. No objection to discharge once home antibiotics arranged.   Principal Problem:   Sepsis due to Escherichia coli (E. coli) (HCC) Active Problems:   Pyelonephritis   Pancreatic duct calculus   COPD (chronic obstructive pulmonary disease) (HCC)   Nicotine dependence   Overweight (BMI 25.0-29.9)   MEDICATIONS:    Scheduled Meds:  Chlorhexidine Gluconate Cloth  6 each Topical Q0600   enoxaparin (LOVENOX) injection  40 mg Subcutaneous Daily   lipase/protease/amylase  72,000 Units Oral TID AC   mupirocin ointment  1 application Nasal BID   nicotine  14 mg Transdermal Daily   Continuous Infusions:  cefTRIAXone (ROCEPHIN)  IV 2 g (12/23/20 0640)   lactated ringers 100 mL/hr at 12/22/20 2255   PRN Meds:.acetaminophen **OR** [DISCONTINUED] acetaminophen, albuterol, ibuprofen, loperamide, ondansetron **OR** ondansetron (ZOFRAN) IV  HPI:    Linda Chang is a 55  y.o. 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  Patient reported that she began experiencing symptoms of dysuria several weeks ago but did not seek any medical evaluation at that time.  Her symptoms continued to progress and over the weekend she began experiencing more dysuria as well as flank pain and fevers.  She was at work and thought she may be dehydrated.  Upon presentation she was found to be septic.  Lab work was notable for WBC 11.0, creatinine 0.9, normal LFTs, lactate 0.5, HIV negative, hemoglobin A1c 6.0, and lipase 19.  She was febrile.  Urinalysis was consistent with infection with positive nitrites, pyuria, and many bacteria.  Urine culture isolated a pansensitive E. coli.  Additionally, her blood cultures grew E. coli in 2 out of 2 sets.  This was also pansensitive.  She has been managed with ceftriaxone during her admission with normalization of her fever curve and leukocytosis.  Initial CT scan obtained on admission without contrast showed a 2.3 cm low-density lesion in the mid to lower pole of the left kidney with surrounding perinephric stranding.  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  There was no evidence of nephrolithiasis or hydronephrosis.  Her case was discussed with interventional radiology who did not recommend aspiration  at this time.  She was also seen by urology who did not have any further interventions at this time.  Additionally, imaging showed pancreatic calcification and ductal dilation.  MCRP was obtained which revealed a 9 mm main pancreatic ductal stone and upstream dilation of the PD to 10 mm.  Fortunately, she has no pancreatitis symptoms and lipase was normal.  She was seen  by GI who recommends outpatient follow up at this time.      Past Medical History:  Diagnosis Date   Anxiety    Arthritis    Asthma    Chronic back pain    Chronic neck pain    COPD (chronic obstructive pulmonary disease) ([place])    Depression    prior hospitalization    Finger joint swelling    Morning joint stiffness    Polyarthralgia    S/P bilateral hip replacements 2004, 2003   Tobacco use     Social History   Tobacco Use   Smoking status: Every Day    Packs/day: 0.25    Years: 30.00    Pack years: 7.50    Types: Cigarettes   Smokeless tobacco: Never  Substance Use Topics   Alcohol use: No    Comment: hx/o heavier ETOH use as teen   Drug use: No    History reviewed. No pertinent family history.  Allergies  Allergen Reactions   Morphine And Related Other (See Comments)    "makes me feel bad"    Review of Systems  Constitutional:  Positive for fever and malaise/fatigue.  HENT: Negative.    Respiratory: Negative.    Cardiovascular: Negative.   Gastrointestinal:  Positive for nausea. Negative for abdominal pain.  Genitourinary:  Positive for dysuria. Negative for flank pain.  Musculoskeletal:  Negative for joint pain.  Skin: Negative.   All other systems reviewed and are negative.  OBJECTIVE:   Blood pressure (!) 147/91, pulse 79, temperature 98 F (36.7 C), temperature source Oral, resp. rate 16, height 5' 7.25" (1.708 m), weight 84.2 kg, last menstrual period 03/02/2014, SpO2 100 %. Body mass index is 28.87 kg/m.  Physical Exam Constitutional:      General: She is not in acute distress.    Appearance: Normal appearance.  HENT:     Head: Normocephalic and atraumatic.  Eyes:     Extraocular Movements: Extraocular movements intact.     Conjunctiva/sclera: Conjunctivae normal.  Cardiovascular:     Rate and Rhythm: Normal rate and regular rhythm.  Pulmonary:     Effort: Pulmonary effort is normal. No respiratory distress.  Abdominal:      General: There is no distension.     Palpations: Abdomen is soft.     Tenderness: There is no abdominal tenderness.  Musculoskeletal:        General: Normal range of motion.  Skin:    General: Skin is warm and dry.     Findings: No rash.  Neurological:     General: No focal deficit present.     Mental Status: She is alert and oriented to person, place, and time.  Psychiatric:        Mood and Affect: Mood normal.        Behavior: Behavior normal.     Lab Results: Lab Results  Component Value Date   WBC 7.5 12/23/2020   HGB 10.8 (L) 12/23/2020   HCT 33.0 (L) 12/23/2020   MCV 89.4 12/23/2020   PLT 164 12/23/2020    Lab Results  Component Value  Date   NA 143 12/23/2020   K 3.1 (L) 12/23/2020   CO2 27 12/23/2020   GLUCOSE 91 12/23/2020   BUN 11 12/23/2020   CREATININE 0.61 12/23/2020   CALCIUM 8.2 (L) 12/23/2020   GFRNONAA >60 12/23/2020   GFRAA >60 06/09/2019    Lab Results  Component Value Date   ALT 17 12/21/2020   AST 16 12/21/2020   ALKPHOS 95 12/21/2020   BILITOT 0.5 12/21/2020    No results found for: CRP     Component Value Date/Time   ESRSEDRATE 6 04/07/2014 1225    I have reviewed the micro and lab results in Epic.  Imaging: CT ABDOMEN PELVIS W CONTRAST  Result Date: 12/21/2020 CLINICAL DATA:  Abdominal abscess/infection suspected. EXAM: CT ABDOMEN AND PELVIS WITH CONTRAST TECHNIQUE: Multidetector CT imaging of the abdomen and pelvis was performed using the standard protocol following bolus administration of intravenous contrast. CONTRAST:  29m OMNIPAQUE IOHEXOL 350 MG/ML SOLN COMPARISON:  CT yesterday. FINDINGS: Lower chest: Minimal dependent atelectasis at the right lung base. Hepatobiliary: Normal Pancreas: Chronic pancreatic calcifications in chronic ductal prominence. No evidence of focal mass. Spleen: Normal Adrenals/Urinary Tract: Adrenal glands are normal. Right kidney is normal. Mild fullness of the right renal collecting system and ureter but  no obstructing abnormality seen. Left kidney again shows what likely represents focal lobar nephronia with the renal abscess measuring about 3 cm in size. Adjacent edema in the Peri renal space. There does not appear to be a ureteral stone. No stone in either kidney. Stomach/Bowel: Stomach and small intestine are normal. No abnormal colon finding. Vascular/Lymphatic: Aortic atherosclerosis. No aneurysm. IVC is normal. No adenopathy. Reproductive: No pelvic mass. Musculoskeletal: Previous bilateral hip replacements. No spinal abnormality. IMPRESSION: Slight enlargement of the left renal process since yesterday, now measuring up to 3 cm. 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 Adjacent stranding in the Peri renal space. I do not think there is an obstructing ureteral lesion. Redemonstration of pancreatic calcification and ductal dilatation. As recommended yesterday, pancreatic MRI could be considered. Electronically Signed   By: MNelson ChimesM.D.   On: 12/21/2020 18:29   MR 3D Recon At Scanner  Result Date: 12/23/2020 CLINICAL DATA:  Pancreatic cancer surveillance in a 55year old female found to have a renal abnormality on recent CT imaging EXAM: MRI ABDOMEN WITHOUT AND WITH CONTRAST (INCLUDING MRCP) TECHNIQUE: Multiplanar multisequence MR imaging of the abdomen was performed both before and after the administration of intravenous contrast. Heavily T2-weighted images of the biliary and pancreatic ducts were obtained, and three-dimensional MRCP images were rendered by post processing. CONTRAST:  8.525mGADAVIST GADOBUTROL 1 MMOL/ML IV SOLN COMPARISON:  Comparison made with December 21, 2020 CT evaluation. FINDINGS: Lower chest: Focal area of abnormality in the LEFT inferior breast measuring 14 mm (image 9/7) no effusion or consolidation at the lung bases with limited assessment on MRI. Hepatobiliary: Cholelithiasis. No pericholecystic stranding. Numerous gallstones in the gallbladder with  6 mm calculus in the gallbladder neck and similar and slightly larger size calculi layering dependently in the gallbladder lumen. Low insertion of posterior division RIGHT hepatic duct just above common hepatic duct/cystic duct confluence. No pericholecystic stranding. Mild biliary duct dilation at between 6 and 7 mm. Mild ductal irregularity in the head of the pancreas without visible filling defect. No focal, suspicious hepatic lesion. Portal vein is patent. Hepatic veins are patent. Pancreas: Pancreas with signs of atrophy and ductal dilation as on the recent CT. Large intraductal calculus  in the head of the pancreas measures approximately 9 mm better seen on the CT as a discrete abnormality and within the main pancreatic duct leading to the upstream ductal obstruction with ductal caliber at 10 mm. No peripancreatic inflammation. Profound atrophy of peripheral pancreas. Little remaining intrinsicw T1 signal within the pancreatic parenchyma. Post-contrast no discrete mass is visualized. Spleen:  Normal size and contour without focal splenic lesion. Adrenals/Urinary Tract:  Adrenal glands are normal. LEFT kidney: Stranding about the interpolar LEFT kidney with fluid collection in the interpolar LEFT kidney as seen on the recent CT. Mild diffuse urothelial enhancement bilaterally. The striated nephrogram pattern seen in the adjacent parenchyma is not as pronounced as on the recent CT evaluation. RIGHT kidney: Diffuse mild urothelial enhancement as discussed. No focal collection in the kidney or adjacent to the kidney. Stomach/Bowel: No acute gastrointestinal process. Limited assessment of gastrointestinal structures on abdominal MRI not performed for bowel evaluation. Vascular/Lymphatic: Patent abdominal vessels including splenic vein. There is no gastrohepatic or hepatoduodenal ligament lymphadenopathy. No retroperitoneal or mesenteric lymphadenopathy. Other:  No ascites. Musculoskeletal: No suspicious bone lesions  identified. Body wall edema similar to recent CT. IMPRESSION: 1. Large intraductal calculus in the head of the pancreas leading to ductal obstruction. Based on the size of this calculus and degree of ductal dilation would correlate with history of symptoms to determine whether further management may be warranted. No discrete mass is visualized. 2. Findings of pyelonephritis with small LEFT renal abscess, less inflammation about the kidney as compared to the prior study but still with diffuse urothelial enhancement throughout the upper tracts. 3. Cholelithiasis mild biliary duct distension without choledocholithiasis. Variant ductal anatomy with low insertion of posterior division RIGHT hepatic duct upon the common hepatic duct. 4. Focal area of abnormality in the LEFT inferior breast measuring 14 mm, correlate with physical examination. Correlation with prior mammography or follow-up mammographic assessment as warranted may be helpful. Electronically Signed   By: Zetta Bills M.D.   On: 12/23/2020 07:52   MR ABDOMEN MRCP W WO CONTAST  Result Date: 12/23/2020 CLINICAL DATA:  Pancreatic cancer surveillance in a 55 year old female found to have a renal abnormality on recent CT imaging EXAM: MRI ABDOMEN WITHOUT AND WITH CONTRAST (INCLUDING MRCP) TECHNIQUE: Multiplanar multisequence MR imaging of the abdomen was performed both before and after the administration of intravenous contrast. Heavily T2-weighted images of the biliary and pancreatic ducts were obtained, and three-dimensional MRCP images were rendered by post processing. CONTRAST:  8.34m GADAVIST GADOBUTROL 1 MMOL/ML IV SOLN COMPARISON:  Comparison made with December 21, 2020 CT evaluation. FINDINGS: Lower chest: Focal area of abnormality in the LEFT inferior breast measuring 14 mm (image 9/7) no effusion or consolidation at the lung bases with limited assessment on MRI. Hepatobiliary: Cholelithiasis. No pericholecystic stranding. Numerous gallstones in the  gallbladder with 6 mm calculus in the gallbladder neck and similar and slightly larger size calculi layering dependently in the gallbladder lumen. Low insertion of posterior division RIGHT hepatic duct just above common hepatic duct/cystic duct confluence. No pericholecystic stranding. Mild biliary duct dilation at between 6 and 7 mm. Mild ductal irregularity in the head of the pancreas without visible filling defect. No focal, suspicious hepatic lesion. Portal vein is patent. Hepatic veins are patent. Pancreas: Pancreas with signs of atrophy and ductal dilation as on the recent CT. Large intraductal calculus in the head of the pancreas measures approximately 9 mm better seen on the CT as a discrete abnormality and within the main pancreatic duct  leading to the upstream ductal obstruction with ductal caliber at 10 mm. No peripancreatic inflammation. Profound atrophy of peripheral pancreas. Little remaining intrinsicw T1 signal within the pancreatic parenchyma. Post-contrast no discrete mass is visualized. Spleen:  Normal size and contour without focal splenic lesion. Adrenals/Urinary Tract:  Adrenal glands are normal. LEFT kidney: Stranding about the interpolar LEFT kidney with fluid collection in the interpolar LEFT kidney as seen on the recent CT. Mild diffuse urothelial enhancement bilaterally. The striated nephrogram pattern seen in the adjacent parenchyma is not as pronounced as on the recent CT evaluation. RIGHT kidney: Diffuse mild urothelial enhancement as discussed. No focal collection in the kidney or adjacent to the kidney. Stomach/Bowel: No acute gastrointestinal process. Limited assessment of gastrointestinal structures on abdominal MRI not performed for bowel evaluation. Vascular/Lymphatic: Patent abdominal vessels including splenic vein. There is no gastrohepatic or hepatoduodenal ligament lymphadenopathy. No retroperitoneal or mesenteric lymphadenopathy. Other:  No ascites. Musculoskeletal: No  suspicious bone lesions identified. Body wall edema similar to recent CT. IMPRESSION: 1. Large intraductal calculus in the head of the pancreas leading to ductal obstruction. Based on the size of this calculus and degree of ductal dilation would correlate with history of symptoms to determine whether further management may be warranted. No discrete mass is visualized. 2. Findings of pyelonephritis with small LEFT renal abscess, less inflammation about the kidney as compared to the prior study but still with diffuse urothelial enhancement throughout the upper tracts. 3. Cholelithiasis mild biliary duct distension without choledocholithiasis. Variant ductal anatomy with low insertion of posterior division RIGHT hepatic duct upon the common hepatic duct. 4. Focal area of abnormality in the LEFT inferior breast measuring 14 mm, correlate with physical examination. Correlation with prior mammography or follow-up mammographic assessment as warranted may be helpful. Electronically Signed   By: Zetta Bills M.D.   On: 12/23/2020 07:52     Imaging independently reviewed in Epic.  [facility] [phone] pager 12/23/2020, 3:23 PM

## 2021-01-11 ENCOUNTER — Inpatient Hospital Stay: Payer: Self-pay | Admitting: Family Medicine

## 2021-01-16 NOTE — Progress Notes (Deleted)
[facility] for Infectious Disease  Reason for Consult:renal abscess  Referring Provider: Hospital follow up   HPI:    Linda Chang is a 55 y.o. female with PMHx as below who presents to the clinic for renal abscess.   She was hospitalized 8/1-12/23/20 after presenting with sepsis from pyelonephritis and E coli bacteremia.  This was in the setting of a small (<3cm) left renal abscess.  Given small size of abscess and clinical improvement there was no plans for aspiration/drainage.  She was treated with IV antibiotics while inpatient and upon discharge transitioned to PO Cefdinir 365m BID for a total duration of 4 weeks.  This was chosen because she does not have insurance so obtaining repeat imaging was deemed to be prohibitive.  Labs at time of discharge showed ESR 68, CRP 8.0 and WBC 7.5.  She presents today for follow up and reports ***.  Her last day of taking Cefdinir will be 01/20/21.  Patient's Medications  New Prescriptions   No medications on file  Previous Medications   ALBUTEROL (VENTOLIN HFA) 108 (90 BASE) MCG/ACT INHALER    Inhale 1-2 puffs into the lungs every 6 (six) hours as needed for wheezing or shortness of breath.   ASPIRIN-SALICYLAMIDE-CAFFEINE (BC HEADACHE PO)    Take 1 packet by mouth daily as needed (pain).   CEFDINIR (OMNICEF) 300 MG CAPSULE    Take 1 capsule (300 mg total) by mouth 2 (two) times daily for 28 days.   LIPASE/PROTEASE/AMYLASE (CREON) 36000 UNITS CPEP CAPSULE    Take 2 capsules (72,000 Units total) by mouth 3 (three) times daily before meals.  Modified Medications   No medications on file  Discontinued Medications   No medications on file      Past Medical History:  Diagnosis Date   Anxiety    Arthritis    Asthma    Chronic back pain    Chronic neck pain    COPD (chronic obstructive pulmonary disease) ([place]    Depression    prior hospitalization    Finger joint swelling    Morning joint stiffness    Polyarthralgia    S/P  bilateral hip replacements 2004, 2003   Tobacco use     Social History   Tobacco Use   Smoking status: Every Day    Packs/day: 0.25    Years: 30.00    Pack years: 7.50    Types: Cigarettes   Smokeless tobacco: Never  Substance Use Topics   Alcohol use: No    Comment: hx/o heavier ETOH use as teen   Drug use: No    No family history on file.  Allergies  Allergen Reactions   Morphine And Related Other (See Comments)    "makes me feel bad"    ROS    OBJECTIVE:    There were no vitals filed for this visit.   There is no height or weight on file to calculate BMI.  Physical Exam   Labs and Microbiology:  CBC Latest Ref Rng & Units 12/23/2020 12/22/2020 12/21/2020  WBC 4.0 - 10.5 K/uL 7.5 10.0 14.7(H)  Hemoglobin 12.0 - 15.0 g/dL 10.8(L) 10.4(L) 11.4(L)  Hematocrit 36.0 - 46.0 % 33.0(L) 32.1(L) 35.4(L)  Platelets 150 - 400 K/uL 164 160 167   CMP Latest Ref Rng & Units 12/23/2020 12/22/2020 12/21/2020  Glucose 70 - 99 mg/dL 91 91 101(H)  BUN 6 - 20 mg/dL _0 Creatinine 0.44 - 1.00 mg/dL 0.61 0.65 0.88  Sodium 135 - 145 mmol/L 143 142 140  Potassium 3.5 - 5.1 mmol/L 3.1(L) 3.4(L) 4.0  Chloride 98 - 111 mmol/L 109 112(H) 112(H)  CO2 22 - 32 mmol/L _0 Calcium 8.9 - 10.3 mg/dL 8.2(L) 8.4(L) 7.9(L)  Total Protein 6.5 - 8.1 g/dL - - 5.3(L)  Total Bilirubin 0.3 - 1.2 mg/dL - - 0.5  Alkaline Phos 38 - 126 U/L - - 95  AST 15 - 41 U/L - - 16  ALT 0 - 44 U/L - - 17     No results found for this or any previous visit (from the past 240 hour(s)).  Imaging: ***   ASSESSMENT & PLAN:    No problem-specific Assessment & Plan notes found for this encounter.   No orders of the defined types were placed in this encounter.    ***  Rayn[facility] 01/16/2021, 5:09 PM

## 2021-01-17 ENCOUNTER — Ambulatory Visit: Payer: Self-pay | Admitting: Internal Medicine

## 2021-01-17 DIAGNOSIS — N12 Tubulo-interstitial nephritis, not specified as acute or chronic: Secondary | ICD-10-CM

## 2021-01-17 DIAGNOSIS — N151 Renal and perinephric abscess: Secondary | ICD-10-CM

## 2021-01-17 DIAGNOSIS — A4151 Sepsis due to Escherichia coli [E. coli]: Secondary | ICD-10-CM

## 2021-02-04 ENCOUNTER — Other Ambulatory Visit: Payer: Self-pay

## 2021-02-07 ENCOUNTER — Inpatient Hospital Stay: Payer: Self-pay | Admitting: Family Medicine

## 2021-09-26 ENCOUNTER — Emergency Department (HOSPITAL_COMMUNITY): Payer: Self-pay

## 2021-09-26 ENCOUNTER — Encounter (HOSPITAL_COMMUNITY)
Admission: EM | Disposition: A | Discharge status: Home / Self Care | Payer: Self-pay | Source: Home / Self Care | Attending: Emergency Medicine

## 2021-09-26 ENCOUNTER — Emergency Department (HOSPITAL_BASED_OUTPATIENT_CLINIC_OR_DEPARTMENT_OTHER): Payer: Self-pay | Admitting: Anesthesiology

## 2021-09-26 ENCOUNTER — Other Ambulatory Visit: Payer: Self-pay

## 2021-09-26 ENCOUNTER — Emergency Department (HOSPITAL_COMMUNITY): Payer: Self-pay | Admitting: Anesthesiology

## 2021-09-26 ENCOUNTER — Ambulatory Visit (HOSPITAL_COMMUNITY)
Admission: EM | Admit: 2021-09-26 | Discharge: 2021-09-26 | Disposition: A | Discharge status: Home / Self Care | Payer: Self-pay | Attending: Emergency Medicine | Admitting: Emergency Medicine

## 2021-09-26 ENCOUNTER — Encounter (HOSPITAL_COMMUNITY): Payer: Self-pay

## 2021-09-26 DIAGNOSIS — S73005A Unspecified dislocation of left hip, initial encounter: Secondary | ICD-10-CM

## 2021-09-26 DIAGNOSIS — F17210 Nicotine dependence, cigarettes, uncomplicated: Secondary | ICD-10-CM | POA: Insufficient documentation

## 2021-09-26 DIAGNOSIS — J449 Chronic obstructive pulmonary disease, unspecified: Secondary | ICD-10-CM | POA: Insufficient documentation

## 2021-09-26 DIAGNOSIS — T84021A Dislocation of internal left hip prosthesis, initial encounter: Secondary | ICD-10-CM

## 2021-09-26 DIAGNOSIS — X58XXXA Exposure to other specified factors, initial encounter: Secondary | ICD-10-CM | POA: Insufficient documentation

## 2021-09-26 HISTORY — PX: HIP CLOSED REDUCTION: SHX983

## 2021-09-26 LAB — SURGICAL PCR SCREEN
MRSA, PCR: POSITIVE — AB
Staphylococcus aureus: POSITIVE — AB

## 2021-09-26 SURGERY — CLOSED REDUCTION, HIP
Anesthesia: General | Site: Hip | Laterality: Left

## 2021-09-26 MED ORDER — LACTATED RINGERS IV SOLN: INTRAVENOUS | Status: DC

## 2021-09-26 MED ORDER — ROCURONIUM BROMIDE 10 MG/ML (PF) SYRINGE
PREFILLED_SYRINGE | INTRAVENOUS | Status: AC
  Filled 2021-09-26: qty 30

## 2021-09-26 MED ORDER — CHLORHEXIDINE GLUCONATE 4 % EX LIQD: 60.0000 mL | Freq: Once | CUTANEOUS | Status: DC

## 2021-09-26 MED ORDER — OXYCODONE-ACETAMINOPHEN 5-325 MG PO TABS: 1.0000 | ORAL_TABLET | Freq: Four times a day (QID) | ORAL | 0 refills | Status: AC | PRN

## 2021-09-26 MED ORDER — ONDANSETRON HCL 4 MG/2ML IJ SOLN
INTRAMUSCULAR | Status: AC
  Filled 2021-09-26: qty 10

## 2021-09-26 MED ORDER — EPHEDRINE 5 MG/ML INJ
INTRAVENOUS | Status: AC
  Filled 2021-09-26: qty 5

## 2021-09-26 MED ORDER — FENTANYL CITRATE PF 50 MCG/ML IJ SOSY
25.0000 ug | PREFILLED_SYRINGE | Freq: Once | INTRAMUSCULAR | Status: AC
  Administered 2021-09-26: 25 ug via INTRAVENOUS
  Filled 2021-09-26: qty 1

## 2021-09-26 MED ORDER — MIDAZOLAM HCL 2 MG/2ML IJ SOLN
INTRAMUSCULAR | Status: AC
  Filled 2021-09-26: qty 2

## 2021-09-26 MED ORDER — LIDOCAINE 2% (20 MG/ML) 5 ML SYRINGE
INTRAMUSCULAR | Status: AC
  Filled 2021-09-26: qty 25

## 2021-09-26 MED ORDER — HYDROMORPHONE HCL 1 MG/ML IJ SOLN: 0.2500 mg | INTRAMUSCULAR | Status: DC | PRN

## 2021-09-26 MED ORDER — HYDROMORPHONE HCL 1 MG/ML IJ SOLN
1.0000 mg | Freq: Once | INTRAMUSCULAR | Status: AC
  Administered 2021-09-26: 1 mg via INTRAVENOUS
  Filled 2021-09-26: qty 1

## 2021-09-26 MED ORDER — METHOCARBAMOL 500 MG PO TABS: 500.0000 mg | ORAL_TABLET | Freq: Three times a day (TID) | ORAL | 0 refills | Status: AC | PRN

## 2021-09-26 MED ORDER — ONDANSETRON HCL 4 MG/2ML IJ SOLN
4.0000 mg | Freq: Once | INTRAMUSCULAR | Status: AC
  Administered 2021-09-26: 4 mg via INTRAVENOUS
  Filled 2021-09-26: qty 2

## 2021-09-26 MED ORDER — PHENYLEPHRINE 80 MCG/ML (10ML) SYRINGE FOR IV PUSH (FOR BLOOD PRESSURE SUPPORT)
PREFILLED_SYRINGE | INTRAVENOUS | Status: AC
  Filled 2021-09-26: qty 20

## 2021-09-26 MED ORDER — FENTANYL CITRATE (PF) 250 MCG/5ML IJ SOLN
INTRAMUSCULAR | Status: AC
  Filled 2021-09-26: qty 5

## 2021-09-26 MED ORDER — FENTANYL CITRATE (PF) 250 MCG/5ML IJ SOLN
INTRAMUSCULAR | Status: DC | PRN
  Administered 2021-09-26: 50 ug via INTRAVENOUS

## 2021-09-26 MED ORDER — MIDAZOLAM HCL 2 MG/2ML IJ SOLN: 0.5000 mg | Freq: Once | INTRAMUSCULAR | Status: DC | PRN

## 2021-09-26 MED ORDER — ACETAMINOPHEN 500 MG PO TABS
ORAL_TABLET | ORAL | Status: AC
  Filled 2021-09-26: qty 2

## 2021-09-26 MED ORDER — ORAL CARE MOUTH RINSE: 15.0000 mL | Freq: Once | OROMUCOSAL | Status: AC

## 2021-09-26 MED ORDER — POVIDONE-IODINE 10 % EX SWAB
2.0000 "application " | Freq: Once | CUTANEOUS | Status: AC
  Administered 2021-09-26: 2 via TOPICAL

## 2021-09-26 MED ORDER — SUCCINYLCHOLINE CHLORIDE 200 MG/10ML IV SOSY
PREFILLED_SYRINGE | INTRAVENOUS | Status: AC
  Filled 2021-09-26: qty 40

## 2021-09-26 MED ORDER — DEXAMETHASONE SODIUM PHOSPHATE 10 MG/ML IJ SOLN
INTRAMUSCULAR | Status: AC
  Filled 2021-09-26: qty 3

## 2021-09-26 MED ORDER — ACETAMINOPHEN 500 MG PO TABS
1000.0000 mg | ORAL_TABLET | Freq: Once | ORAL | Status: AC
  Administered 2021-09-26: 1000 mg via ORAL

## 2021-09-26 MED ORDER — MEPERIDINE HCL 25 MG/ML IJ SOLN: 6.2500 mg | INTRAMUSCULAR | Status: DC | PRN

## 2021-09-26 MED ORDER — MIDAZOLAM HCL 2 MG/2ML IJ SOLN
INTRAMUSCULAR | Status: DC | PRN
  Administered 2021-09-26: 2 mg via INTRAVENOUS

## 2021-09-26 MED ORDER — ONDANSETRON HCL 4 MG/2ML IJ SOLN
INTRAMUSCULAR | Status: DC | PRN
  Administered 2021-09-26: 4 mg via INTRAVENOUS

## 2021-09-26 MED ORDER — PROPOFOL 10 MG/ML IV BOLUS
1.0000 mg/kg | Freq: Once | INTRAVENOUS | Status: AC
  Administered 2021-09-26: 72.6 mg via INTRAVENOUS
  Filled 2021-09-26: qty 20

## 2021-09-26 MED ORDER — OXYCODONE HCL 5 MG PO TABS: 5.0000 mg | ORAL_TABLET | Freq: Once | ORAL | Status: DC | PRN

## 2021-09-26 MED ORDER — CHLORHEXIDINE GLUCONATE 0.12 % MT SOLN
OROMUCOSAL | Status: AC
  Administered 2021-09-26: 15 mL via OROMUCOSAL
  Filled 2021-09-26: qty 15

## 2021-09-26 MED ORDER — OXYCODONE HCL 5 MG/5ML PO SOLN: 5.0000 mg | Freq: Once | ORAL | Status: DC | PRN

## 2021-09-26 MED ORDER — PROPOFOL 10 MG/ML IV BOLUS
INTRAVENOUS | Status: DC | PRN
  Administered 2021-09-26: 80 mg via INTRAVENOUS

## 2021-09-26 MED ORDER — LIDOCAINE 2% (20 MG/ML) 5 ML SYRINGE
INTRAMUSCULAR | Status: DC | PRN
  Administered 2021-09-26: 60 mg via INTRAVENOUS

## 2021-09-26 MED ORDER — CHLORHEXIDINE GLUCONATE 0.12 % MT SOLN: 15.0000 mL | Freq: Once | OROMUCOSAL | Status: AC

## 2021-09-26 MED ORDER — SUCCINYLCHOLINE CHLORIDE 200 MG/10ML IV SOSY
PREFILLED_SYRINGE | INTRAVENOUS | Status: DC | PRN
  Administered 2021-09-26: 60 mg via INTRAVENOUS

## 2021-09-26 MED ORDER — PROPOFOL 10 MG/ML IV BOLUS
INTRAVENOUS | Status: AC
  Filled 2021-09-26: qty 20

## 2021-09-26 NOTE — Anesthesia Preprocedure Evaluation (Addendum)
Anesthesia Evaluation  ?Patient identified by MRN, date of birth, ID band ?Patient awake ? ? ? ?Reviewed: ?Allergy & Precautions, NPO status , Patient's Chart, lab work & pertinent test results ? ?History of Anesthesia Complications ?Negative for: history of anesthetic complications ? ?Airway ?Mallampati: I ? ?TM Distance: >3 FB ?Neck ROM: Full ? ? ? Dental ? ?(+) Edentulous Upper, Edentulous Lower, Lower Dentures, Upper Dentures ?  ?Pulmonary ?COPD,  COPD inhaler, Current Smoker and Patient abstained from smoking.,  ?  ?breath sounds clear to auscultation ? ? ? ? ? ? Cardiovascular ?negative cardio ROS ? ? ?Rhythm:Regular Rate:Normal ? ? ?  ?Neuro/Psych ?Anxiety Depression negative neurological ROS ?   ? GI/Hepatic ?negative GI ROS, Neg liver ROS,   ?Endo/Other  ?negative endocrine ROS ? Renal/GU ?negative Renal ROS  ? ?  ?Musculoskeletal ? ?(+) Arthritis , Osteoarthritis,   ? Abdominal ?  ?Peds ? Hematology ?negative hematology ROS ?(+)   ?Anesthesia Other Findings ? ? Reproductive/Obstetrics ? ?  ? ? ? ? ? ? ? ? ? ? ? ? ? ?  ?  ? ? ? ? ? ? ? ?Anesthesia Physical ?Anesthesia Plan ? ?ASA: 2 ? ?Anesthesia Plan: General  ? ?Post-op Pain Management: Tylenol PO (pre-op)*  ? ?Induction: Intravenous ? ?PONV Risk Score and Plan: 2 and Ondansetron and Dexamethasone ? ?Airway Management Planned: Mask and Natural Airway ? ?Additional Equipment: None ? ?Intra-op Plan:  ? ?Post-operative Plan:  ? ?Informed Consent: I have reviewed the patients History and Physical, chart, labs and discussed the procedure including the risks, benefits and alternatives for the proposed anesthesia with the patient or authorized representative who has indicated his/her understanding and acceptance.  ? ? ? ?Dental advisory given ? ?Plan Discussed with: CRNA ? ?Anesthesia Plan Comments:   ? ? ? ? ? ?Anesthesia Quick Evaluation ? ?

## 2021-09-26 NOTE — Anesthesia Procedure Notes (Signed)
Procedure Name: General with mask airway ?Date/Time: 09/26/2021 5:49 PM ?Performed by: Carolan Clines, CRNA ?Pre-anesthesia Checklist: Patient identified, Emergency Drugs available, Suction available and Patient being monitored ?Patient Re-evaluated:Patient Re-evaluated prior to induction ?Oxygen Delivery Method: Circle system utilized ?Preoxygenation: Pre-oxygenation with 100% oxygen ?Induction Type: IV induction ?Ventilation: Mask ventilation without difficulty ?Dental Injury: Teeth and Oropharynx as per pre-operative assessment  ? ? ? ? ?

## 2021-09-26 NOTE — Brief Op Note (Signed)
? ?  09/26/2021 ? ?6:07 PM ? ?PATIENT:  Linda Chang  56 y.o. female ? ?PRE-OPERATIVE DIAGNOSIS:  Left Hip Dislocation ? ?POST-OPERATIVE DIAGNOSIS:  Left Hip Dislocation ? ?PROCEDURE:  Procedure(s): ?CLOSED REDUCTION HIP ? ?SURGEON:  Surgeon(s): ?Meredith Pel, MD ? ?ASSISTANT: magnant pa ? ?ANESTHESIA:   general ? ?EBL: 0 ml   ? ?No intake/output data recorded. ? ?BLOOD ADMINISTERED: none ? ?DRAINS: none  ? ?LOCAL MEDICATIONS USED:  none ? ?SPECIMEN:  No Specimen ? ?COUNTS:  YES ? ?TOURNIQUET:  * No tourniquets in log * ? ?DICTATION: .Other Dictation: Dictation Number [id number] ? ?PLAN OF CARE: Discharge to home after PACU ? ?PATIENT DISPOSITION:  PACU - hemodynamically stable ? ? ? ? ? ? ? ? ? ? ? ? ?  ?

## 2021-09-26 NOTE — ED Triage Notes (Signed)
Pt BIB [facility] from [facility] c/o left hip dislocation. Patient states that she slept in a recliner the night before. Patient states this morning around 0130, she stood and twisted to get in the bed when the hip full dislocated. 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 Hx of asthma and bilateral hip replacement 20 years ago.  ?

## 2021-09-26 NOTE — Transfer of Care (Signed)
Immediate Anesthesia Transfer of Care Note ? ?Patient: Linda Chang ? ?Procedure(s) Performed: CLOSED REDUCTION HIP (Left: Hip) ? ?Patient Location: PACU ? ?Anesthesia Type:General ? ?Level of Consciousness: drowsy ? ?Airway & Oxygen Therapy: Patient Spontanous Breathing and Patient connected to face mask oxygen ? ?Post-op Assessment: Report given to RN and Post -op Vital signs reviewed and stable ? ?Post vital signs: Reviewed and stable ? ?Last Vitals:  ?Vitals Value Taken Time  ?BP 106/61 09/26/21 1807  ?Temp    ?Pulse 53 09/26/21 1807  ?Resp 11 09/26/21 1807  ?SpO2 100 % 09/26/21 1807  ?Vitals shown include unvalidated device data. ? ?Last Pain:  ?Vitals:  ? 09/26/21 1604  ?TempSrc: Oral  ?PainSc: 7   ?   ? ?Patients Stated Pain Goal: 2 (09/26/21 1604) ? ?Complications: No notable events documented. ?

## 2021-09-26 NOTE — Progress Notes (Signed)
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 ?Patient was treated with profend in pre-op. ? ?Secure message sent to Dr. Marlou Sa at [phone]. ?

## 2021-09-26 NOTE — ED Triage Notes (Signed)
Transfer from [facility] for hip surgery today. ?

## 2021-09-26 NOTE — H&P (Signed)
Linda Chang is an 56 y.o. female.   ?Chief Complaint: Left hip pain ?HPI: Patient presents for evaluation of left hip pain.  Had total hip replacement placed about 20 years ago in [place] by Dr. Richardo Priest.  Developed instability 2 years after the index procedure and has had dislocations about 6/year since then.  Has been recommended to have revision on multiple occasions but insurance issues have prevented that from happening.  Today she was getting out of a lower recliner and felt the hip dislocate.  Radiographs demonstrate dislocation with no periprosthetic fracture.  Plan for reduction under general anesthetic with muscle relaxation.  Risk and benefits are discussed with the patient including not limited to infection nerve vessel damage fracture as well as potential for not being able to reduce the hip.  All questions answered ? ?Past Medical History:  ?Diagnosis Date  ? Anxiety   ? Arthritis   ? Asthma   ? Chronic back pain   ? Chronic neck pain   ? COPD (chronic obstructive pulmonary disease) ([place])   ? Depression   ? prior hospitalization   ? Finger joint swelling   ? Morning joint stiffness   ? Polyarthralgia   ? S/P bilateral hip replacements 2004, 2003  ? Tobacco use   ? ? ?Past Surgical History:  ?Procedure Laterality Date  ? TONSILLECTOMY    ? TOTAL HIP ARTHROPLASTY Bilateral   ? ? ?History reviewed. No pertinent family history. ?Social History:  reports that she has been smoking cigarettes. She has a 7.50 pack-year smoking history. She has never used smokeless tobacco. She reports that she does not drink alcohol and does not use drugs. ? ?Allergies:  ?Allergies  ?Allergen Reactions  ? Morphine And Related Other (See Comments)  ?  "makes me feel bad"  ? ? ?Medications Prior to Admission  ?Medication Sig Dispense Refill  ? albuterol (VENTOLIN HFA) 108 (90 Base) MCG/ACT inhaler Inhale 1-2 puffs into the lungs every 6 (six) hours as needed for wheezing or shortness of breath. 1 each 1  ?  Aspirin-Salicylamide-Caffeine (BC HEADACHE PO) Take 1 packet by mouth daily as needed (pain).    ? lipase/protease/amylase (CREON) 36000 UNITS CPEP capsule Take 2 capsules (72,000 Units total) by mouth 3 (three) times daily before meals. 180 capsule 1  ? ? ?No results found for this or any previous visit (from the past 48 hour(s)). ?DG Hip [place] W or Wo Pelvis 1 View Left ? ?Result Date: 09/26/2021 ?CLINICAL DATA:  Status post hip dislocation reduction EXAM: DG HIP (WITH OR WITHOUT PELVIS) 1V PORT LEFT COMPARISON:  09/26/2021 earlier same day FINDINGS: No acute fracture. Generalized osteopenia. Right total hip arthroplasty without failure or complication. Left total hip arthroplasty. Superior dislocation of the left femoral head relative to the acetabular cup with the femoral head perched along the superior aspect of the acetabular cup. No aggressive osseous lesion. Soft tissue are unremarkable. No radiopaque foreign body or soft tissue emphysema. IMPRESSION: 1. Left total hip arthroplasty with superior dislocation of the left femoral head relative to the acetabular cup. Electronically Signed   By: Kathreen Devoid M.D.   On: 09/26/2021 10:42  ? ?DG Hip Unilat W or Wo Pelvis 2-3 Views Left ? ?Result Date: 09/26/2021 ?CLINICAL DATA:  Left hip pain. EXAM: DG HIP (WITH OR WITHOUT PELVIS) 2-3V LEFT COMPARISON:  February 28, 2020. FINDINGS: Status post bilateral total hip arthroplasties. There appears to be superior and probably posterior dislocation of the left femoral prosthesis. No definite  fracture is noted. IMPRESSION: There is noted superior and probably posterior dislocation of the left total hip arthroplasty. Electronically Signed   By: Marijo Conception M.D.   On: 09/26/2021 08:56   ? ?Review of Systems  ?Musculoskeletal:  Positive for arthralgias.  ?All other systems reviewed and are negative. ? ?Blood pressure 127/81, pulse 61, temperature 97.8 ?F (36.6 ?C), temperature source Oral, resp. rate 16, height 5' 7.25"  (1.708 m), weight 72.6 kg, last menstrual period 03/02/2014, SpO2 100 %. ?Physical Exam ?Vitals reviewed.  ?HENT:  ?   Head: Normocephalic.  ?   Nose: Nose normal.  ?   Mouth/Throat:  ?   Mouth: Mucous membranes are moist.  ?Eyes:  ?   Pupils: Pupils are equal, round, and reactive to light.  ?Cardiovascular:  ?   Rate and Rhythm: Normal rate.  ?   Pulses: Normal pulses.  ?Pulmonary:  ?   Effort: Pulmonary effort is normal.  ?Abdominal:  ?   General: Abdomen is flat.  ?Musculoskeletal:  ?   Cervical back: Normal range of motion.  ?Skin: ?   General: Skin is warm.  ?   Capillary Refill: Capillary refill takes less than 2 seconds.  ?Neurological:  ?   General: No focal deficit present.  ?   Mental Status: She is alert.  ?Psychiatric:     ?   Mood and Affect: Mood normal.  ?Examination of the left lower extremity demonstrates internal rotation and shortening.  Ankle dorsiflexion intact. ? ?Assessment/Plan ?Impression is unstable left total hip arthroplasty placed by physician in [place] 20 years ago.  Will need revision.  Plan is for reduction today.  I will check with one of my partners to see if they are interested in potentially revising this but this is a difficult problem.  Ideally she would go back to [place] but there is some type of outstanding balance which prevents her from being seen there. ? ?Anderson Malta, MD ?09/26/2021, 5:19 PM ? ? ? ?

## 2021-09-26 NOTE — ED Provider Notes (Signed)
?[facility] DEPT ?Provider Note ? ? ?CSN: [id number] ?Arrival date & time: 09/26/21  0746 ? ?  ? ?History ?Chief Complaint  ?Patient presents with  ? Hip Pain  ? ? ?Linda Chang is a 56 y.o. female who presents to the emergency department with left-sided hip pain.  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  She has bilateral hip arthroplasties.  Patient states that she has been sleeping in the recliner at a friend's house and got up this morning around 1 AM and went to turn and felt her hip dislocate.  Patient tried reducing it on her own without success.  Pain is increased which prompted her arrival to the emergency department today.  She denies any other injury. ? ?HPI ? ?  ? ?Home Medications ?Prior to Admission medications   ?Medication Sig Start Date End Date Taking? Authorizing Provider  ?albuterol (VENTOLIN HFA) 108 (90 Base) MCG/ACT inhaler Inhale 1-2 puffs into the lungs every 6 (six) hours as needed for wheezing or shortness of breath. 12/23/20   Annita Brod, MD  ?Aspirin-Salicylamide-Caffeine [facility] HEADACHE PO) Take 1 packet by mouth daily as needed (pain).    [provider]  ?lipase/protease/amylase (CREON) 36000 UNITS CPEP capsule Take 2 capsules (72,000 Units total) by mouth 3 (three) times daily before meals. 12/23/20   Annita Brod, MD  ?   ? ?Allergies    ?Morphine and related   ? ?Review of Systems   ?Review of Systems  ?All other systems reviewed and are negative. ? ?Physical Exam ?Updated Vital Signs ?BP (!) 159/81   Pulse (!) 58   Temp 97.6 ?F (36.4 ?C) (Oral)   Resp 20   Ht 5' 7.25" (1.708 m)   Wt 72.6 kg   LMP 03/02/2014   SpO2 100%   BMI 24.87 kg/m?  ?Physical Exam ?Vitals and nursing note reviewed.  ?Constitutional:   ?   General: She is not in acute distress. ?   Appearance: Normal appearance.  ?HENT:  ?   Head: Normocephalic and atraumatic.  ?Eyes:  ?   General:     ?   Right eye: No discharge.     ?   Left eye:  No discharge.  ?Cardiovascular:  ?   Comments: Regular rate and rhythm.  S1/S2 are distinct without any evidence of murmur, rubs, or gallops. No evidence x-ray ?Pulmonary:  ?   Comments: Clear to auscultation bilaterally.  Normal effort.  No respiratory distress.  No evidence of wheezes, rales, or rhonchi heard throughout. ?Abdominal:  ?   General: Abdomen is flat. Bowel sounds are normal. There is no distension.  ?   Tenderness: There is no abdominal tenderness. There is no guarding or rebound.  ?Musculoskeletal:     ?   General: Normal range of motion.  ?   Cervical back: Neck supple.  ?   Comments: Left leg is shortened but not internally or externally rotated.  Strong 2+ dorsalis pedis pulse felt on the left.  ?Skin: ?   General: Skin is warm and dry.  ?   Findings: No rash.  ?Neurological:  ?   General: No focal deficit present.  ?   Mental Status: She is alert.  ?Psychiatric:     ?   Mood and Affect: Mood normal.     ?   Behavior: Behavior normal.  ? ? ?ED Results / Procedures / Treatments   ?Labs ?(all labs ordered are listed, but  only abnormal results are displayed) ?Labs Reviewed - No data to display ? ?EKG ?None ? ?Radiology ?DG Hip [place] or [place] Pelvis 1 View Left ? ?Result Date: 09/26/2021 ?CLINICAL DATA:  Status post hip dislocation reduction EXAM: DG HIP (WITH OR WITHOUT PELVIS) 1V PORT LEFT COMPARISON:  09/26/2021 earlier same day FINDINGS: No acute fracture. Generalized osteopenia. Right total hip arthroplasty without failure or complication. Left total hip arthroplasty. Superior dislocation of the left femoral head relative to the acetabular cup with the femoral head perched along the superior aspect of the acetabular cup. No aggressive osseous lesion. Soft tissue are unremarkable. No radiopaque foreign body or soft tissue emphysema. IMPRESSION: 1. Left total hip arthroplasty with superior dislocation of the left femoral head relative to the acetabular cup. Electronically Signed   By: Kathreen Devoid M.D.   On: 09/26/2021 10:42  ? ?DG Hip Unilat W or Wo Pelvis 2-3 Views Left ? ?Result Date: 09/26/2021 ?CLINICAL DATA:  Left hip pain. EXAM: DG HIP (WITH OR WITHOUT PELVIS) 2-3V LEFT COMPARISON:  February 28, 2020. FINDINGS: Status post bilateral total hip arthroplasties. There appears to be superior and probably posterior dislocation of the left femoral prosthesis. No definite fracture is noted. IMPRESSION: There is noted superior and probably posterior dislocation of the left total hip arthroplasty. Electronically Signed   By: Marijo Conception M.D.   On: 09/26/2021 08:56   ? ?Procedures ?Reduction of dislocation ? ?Date/Time: 09/26/2021 11:46 AM ?Performed by: Hendricks Limes, PA-C ?Authorized by: Hendricks Limes, PA-C  ?Consent: The procedure was performed in an emergent situation. Verbal consent obtained. ?Consent given by: patient ?Patient understanding: patient states understanding of the procedure being performed ?Patient consent: the patient's understanding of the procedure matches consent given ?Procedure consent: procedure consent matches procedure scheduled ?Relevant documents: relevant documents present and verified ?Test results: test results available and properly labeled ?Site marked: the operative site was marked ?Imaging studies: imaging studies available ?Required items: required blood products, implants, devices, and special equipment available ?Patient identity confirmed: verbally with patient and arm band ?Time out: Immediately prior to procedure a "time out" was called to verify the correct patient, procedure, equipment, support staff and site/side marked as required. ?Local anesthesia used: no ? ?Anesthesia: ?Local anesthesia used: no ? ?Sedation: ?Patient sedated: yes ?Sedatives: propofol ?Analgesia: fentanyl ?Vitals: Vital signs were monitored during sedation. ? ?Patient tolerance: patient tolerated the procedure well with no immediate complications ?Comments: Reduction was  unsuccessful. ? ?  ? ? ?Medications Ordered in ED ?Medications  ?lactated ringers infusion ( Intravenous New Bag/Given 09/26/21 0956)  ?fentaNYL (SUBLIMAZE) injection 25 mcg (has no administration in time range)  ?fentaNYL (SUBLIMAZE) injection 25 mcg (25 mcg Intravenous Given 09/26/21 0821)  ?propofol (DIPRIVAN) 10 mg/mL bolus/IV push 72.6 mg (72.6 mg Intravenous Given 09/26/21 1010)  ?ondansetron [facility]) injection 4 mg (4 mg Intravenous Given 09/26/21 0932)  ? ? ?ED Course/ Medical Decision Making/ A&P ?Clinical Course as of 09/26/21 1148  ?Mon Sep 26, 2021  ?1041 I spoke with a representative with Dr. Marlou Sa with orthopedics and he is currently in the OR.  He requests me to call [facility] as they did the most recent hip reduction on chart review.  Dr. Marlou Sa will give me a call back in roughly 20 minutes. [CF]  ?1133 I spoke with Dr. Marlou Sa with orthopedics once more who recommends bringing the patient over to [facility] where he will reduce the hip.  He states that he will  work to get her into the peri or preop area. [CF]  ?1133 I spoke with Dr. Francia Greaves emergency department attending over at [facility] who accepts the patient. [CF]  ?1135 I updated the patient on images and plan.  Patient is in pain we will give her more pain medication. [CF]  ?  ?Clinical Course User Index ?[CF] Myna Bright M, PA-C  ? ?                        ?Medical Decision Making ?Amount and/or Complexity of Data Reviewed ?Radiology: ordered. ? ?Risk ?Prescription drug management. ? ? ?This patient presents to the ED for concern of hip dislocation, this involves an extensive number of treatment options, and is a complaint that carries with it a high risk of complications and morbidity.  The differential diagnosis includes hip dislocation versus fracture, hip pain, bursitis, hardware malfunction. ? ? ?Co morbidities that complicate the patient evaluation ? ?Past Medical History:  ?Diagnosis Date  ? Anxiety   ? Arthritis   ? Asthma   ?  Chronic back pain   ? Chronic neck pain   ? COPD (chronic obstructive pulmonary disease) ([place])   ? Depression   ? prior hospitalization   ? Finger joint swelling   ? Morning joint stiffness   ? Polyarthralgia   ? S/P bilateral hip

## 2021-09-26 NOTE — ED Notes (Signed)
Conscience sedation: ? ?Timeout at 1003 with respiratory, ortho tech, Hassell Done NT,Ryo Klang RN, and [place] MD, Raul Del PA ? ?70 prop IV given at 1005 ?20 prop given at 1007 ?30 Prop given at 1010 ?20 prop given at 1013 ? Left hip back in at 1019 ? ?Awaiting x-ray to confirm placement ?

## 2021-09-26 NOTE — ED Provider Notes (Signed)
Patient transferred from [facility] for hip surgery for dislocation.  She arrived in the emergency department at this time.  Spoke with Dr. Marlou Sa who reports that she is already on the OR schedule for 5:00 today.  We will make sure that she is sent to the preoperative area. ?  ?Anselmo Pickler, PA-C ?09/26/21 1412 ? ?  ?Regan Lemming, MD ?09/26/21 1843 ? ?

## 2021-09-26 NOTE — Progress Notes (Signed)
Orthopedic Tech Progress Note ?Patient Details:  ?Linda Chang ?[birth year] ?[id number] ? ?Ortho Devices ?Type of Ortho Device: Knee Immobilizer, Crutches ?Ortho Device/Splint Location: left Hip reduction ?Ortho Device/Splint Interventions: Application ?  ?Post Interventions ?Patient Tolerated: Well ?Instructions Provided: Care of device, Adjustment of device ? ?Linda Chang ?09/26/2021, 10:26 AM ? ?

## 2021-09-27 ENCOUNTER — Encounter (HOSPITAL_COMMUNITY): Payer: Self-pay | Admitting: Orthopedic Surgery

## 2021-09-27 NOTE — ED Provider Notes (Signed)
.  Sedation ? ?Date/Time: 09/26/2021 10:30 AM ?Performed by: Godfrey Pick, MD ?Authorized by: Godfrey Pick, MD  ? ?Consent:  ?  Consent obtained:  Verbal ?  Consent given by:  Patient ?  Risks discussed:  Prolonged hypoxia resulting in organ damage, dysrhythmia, inadequate sedation, respiratory compromise necessitating ventilatory assistance and intubation, nausea and vomiting ?Universal protocol:  ?  Imaging studies available: yes   ?  Immediately prior to procedure, a time out was called: yes   ?  Patient identity confirmed:  Verbally with patient and arm band ?Indications:  ?  Procedure performed:  Dislocation reduction ?  Procedure necessitating sedation performed by:  Different physician ?Pre-sedation assessment:  ?  Time since last food or drink:  24 hours ?  ASA classification: class 2 - patient with mild systemic disease   ?  Mouth opening:  3 or more finger widths ?  Thyromental distance:  3 finger widths ?  Mallampati score:  II - soft palate, uvula, fauces visible ?  Neck mobility: normal   ?  Pre-sedation assessments completed and reviewed: airway patency, cardiovascular function, hydration status, mental status, nausea/vomiting, pain level and respiratory function   ?  Pre-sedation assessment completed:  09/26/2021 9:45 AM ?Immediate pre-procedure details:  ?  Reassessment: Patient reassessed immediately prior to procedure   ?  Reviewed: vital signs and relevant labs/tests   ?  Verified: bag valve mask available, emergency equipment available, intubation equipment available, IV patency confirmed, oxygen available and suction available   ?Procedure details (see MAR for exact dosages):  ?  Preoxygenation:  Room air ?  Sedation:  Propofol ?  Intended level of sedation: moderate (conscious sedation) ?  Analgesia:  Fentanyl ?  Intra-procedure monitoring:  Blood pressure monitoring, cardiac monitor, continuous capnometry, continuous pulse oximetry, frequent LOC assessments and frequent vital sign checks ?   Intra-procedure events: none   ?  Total Provider sedation time (minutes):  15 ?Post-procedure details:  ?  Post-sedation assessment completed:  09/26/2021 10:20 AM ?  Attendance: Constant attendance by certified staff until patient recovered   ?  Recovery: Patient returned to pre-procedure baseline   ?  Post-sedation assessments completed and reviewed: airway patency, cardiovascular function, hydration status, mental status, nausea/vomiting, pain level and respiratory function   ?  Patient is stable for discharge or admission: yes   ?  Procedure completion:  Tolerated well, no immediate complications ? ?  ?Godfrey Pick, MD ?09/27/21 [phone] ? ?

## 2021-09-27 NOTE — Op Note (Signed)
NAME: Linda Chang, Linda Chang ?MEDICAL RECORD NO: [id number] ?ACCOUNT NO: [id number] ?DATE OF BIRTH: 17-Apr-1966 ?FACILITY: [facility] ?LOCATION: [facility] ?PHYSICIAN: Yetta Barre. Marlou Sa, MD ? ?Operative Report  ? ?DATE OF PROCEDURE: 09/26/2021 ? ?PREOPERATIVE DIAGNOSIS:  Left total hip dislocation. ? ?POSTOPERATIVE DIAGNOSIS:  Left total hip dislocation. ? ?PROCEDURE:  Closed reduction of left total hip dislocation. ? ?SURGEON:  Yetta Barre. Marlou Sa, MD ? ?ASSISTANT:  Annie Main. ? 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  This was placed in [place] 20 years ago.  Two years after the index procedure, she began having dislocations.  This was not  ?treated at the institution where it was placed because of outstanding balances on her bill according to the patient. ? ?DESCRIPTION OF PROCEDURE:  The patient was brought to the operating room where general anesthetic was induced.  Timeout was called.  Sara Lee, [place] applied counterpressure to the anterior superior iliac crest on the left hip.  Using a combination of  ?adduction, traction and internal and external rotation, the hip was reduced.  Fluoroscopic imaging demonstrated concentric reduction with no femoral or acetabular fracture.  Hip was taken through range of motion and found to be stable.  Knee immobilizer  ?placed, which was well padded.  The patient will be weightbearing as tolerated in the knee immobilizer at all times for the first 3 weeks.  She will need revision hip replacement at some time in the future.  Luke's assistance was required for  ?countertraction during the reduction maneuver because significant force was required to reduce the hip. ? ? ?[facility] ?D: 09/26/2021 6:10:54 pm T: 09/27/2021 3:07:00 am  ?JOB: [id number]/ [id number]  ?

## 2021-09-27 NOTE — Anesthesia Postprocedure Evaluation (Signed)
Anesthesia Post Note ? ?Patient: Linda Chang ? ?Procedure(s) Performed: CLOSED REDUCTION HIP (Left: Hip) ? ?  ? ?Patient location during evaluation: PACU ?Anesthesia Type: General ?Level of consciousness: patient cooperative and awake ?Pain management: pain level controlled ?Vital Signs Assessment: post-procedure vital signs reviewed and stable ?Respiratory status: spontaneous breathing, nonlabored ventilation, respiratory function stable and patient connected to nasal cannula oxygen ?Cardiovascular status: blood pressure returned to baseline and stable ?Postop Assessment: no apparent nausea or vomiting ?Anesthetic complications: no ? ? ?No notable events documented. ? ?Last Vitals:  ?Vitals:  ? 09/26/21 1836 09/26/21 1851  ?BP: 101/86 125/79  ?Pulse: (!) 51 70  ?Resp: 13 16  ?Temp: 36.6 ?C   ?SpO2: 100% 100%  ?  ?Last Pain:  ?Vitals:  ? 09/26/21 1851  ?TempSrc:   ?PainSc: 0-No pain  ? ? ?  ?  ?  ?  ?  ?  ? ?Linda Chang ? ? ? ? ?

## 2021-10-14 DIAGNOSIS — T84021A Dislocation of internal left hip prosthesis, initial encounter: Secondary | ICD-10-CM

## 2022-08-01 ENCOUNTER — Other Ambulatory Visit: Payer: Self-pay

## 2022-08-01 ENCOUNTER — Emergency Department (HOSPITAL_COMMUNITY)
Admission: EM | Admit: 2022-08-01 | Discharge: 2022-08-01 | Disposition: A | Discharge status: Home / Self Care | Payer: Self-pay | Attending: Emergency Medicine | Admitting: Emergency Medicine

## 2022-08-01 DIAGNOSIS — M79604 Pain in right leg: Secondary | ICD-10-CM | POA: Insufficient documentation

## 2022-08-01 DIAGNOSIS — Z5321 Procedure and treatment not carried out due to patient leaving prior to being seen by health care provider: Secondary | ICD-10-CM | POA: Insufficient documentation

## 2022-08-01 DIAGNOSIS — M79605 Pain in left leg: Secondary | ICD-10-CM | POA: Insufficient documentation

## 2022-08-01 DIAGNOSIS — E876 Hypokalemia: Secondary | ICD-10-CM | POA: Insufficient documentation

## 2022-08-01 LAB — URINALYSIS, ROUTINE W REFLEX MICROSCOPIC
Bacteria, UA: NONE SEEN
Bilirubin Urine: NEGATIVE
Glucose, UA: NEGATIVE mg/dL
Ketones, ur: NEGATIVE mg/dL
Leukocytes,Ua: NEGATIVE
Nitrite: NEGATIVE
Protein, ur: NEGATIVE mg/dL
Specific Gravity, Urine: 1.02 (ref 1.005–1.030)
pH: 5 (ref 5.0–8.0)

## 2022-08-01 NOTE — ED Provider Triage Note (Signed)
Emergency Medicine Provider Triage Evaluation Note  Jaida Berrier , a 57 y.o. female  was evaluated in triage.  Pt complains of bilateral leg pain that is been ongoing for the last couple days.  Prior history of hypokalemia, feels that her potassium is likely low as she has been working a lot, and has not been taking care of herself or eating.  No bowel or bladder complaints..  Review of Systems  Positive: Bilateral leg pain Negative: Urinary symptoms  Physical Exam  BP (!) 139/99 (BP Location: Right Arm)   Pulse 97   Temp 98.1 F (36.7 C) (Oral)   Resp 16   Wt 72 kg   LMP 03/02/2014   SpO2 100%   BMI 24.68 kg/m  Gen:   Awake, no distress   Resp:  Normal effort  MSK:   Moves extremities without difficulty  Other:    Medical Decision Making  Medically screening exam initiated at 12:00 PM.  Appropriate orders placed.  Athanasia Kriete was informed that the remainder of the evaluation will be completed by another provider, this initial triage assessment does not replace that evaluation, and the importance of remaining in the ED until their evaluation is complete.    Janeece Fitting, PA-C 08/01/22 1202

## 2022-08-01 NOTE — ED Triage Notes (Signed)
C/o leg pain bilateral described as aching and headache x5 days.
